# Patient Record
Sex: Male | Born: 1935 | Race: White | Hispanic: No | Smoking: Never smoker
Health system: Southern US, Community
[De-identification: ages and names within clinical notes are randomized; demographics above are authoritative.]

## PROBLEM LIST (undated history)

## (undated) DIAGNOSIS — I4891 Unspecified atrial fibrillation: Secondary | ICD-10-CM

## (undated) DIAGNOSIS — N401 Enlarged prostate with lower urinary tract symptoms: Secondary | ICD-10-CM

## (undated) DIAGNOSIS — H3530 Unspecified macular degeneration: Secondary | ICD-10-CM

## (undated) DIAGNOSIS — I509 Heart failure, unspecified: Secondary | ICD-10-CM

## (undated) DIAGNOSIS — Z7901 Long term (current) use of anticoagulants: Secondary | ICD-10-CM

## (undated) DIAGNOSIS — I4510 Unspecified right bundle-branch block: Secondary | ICD-10-CM

## (undated) DIAGNOSIS — N183 Chronic kidney disease, stage 3 unspecified: Secondary | ICD-10-CM

## (undated) DIAGNOSIS — H409 Unspecified glaucoma: Secondary | ICD-10-CM

## (undated) DIAGNOSIS — E538 Deficiency of other specified B group vitamins: Secondary | ICD-10-CM

## (undated) DIAGNOSIS — H8110 Benign paroxysmal vertigo, unspecified ear: Secondary | ICD-10-CM

## (undated) HISTORY — PX: COLONOSCOPY W/ POLYPECTOMY: SHX1380

---

## 2015-10-02 DIAGNOSIS — N400 Enlarged prostate without lower urinary tract symptoms: Secondary | ICD-10-CM | POA: Diagnosis present

## 2015-10-02 DIAGNOSIS — I509 Heart failure, unspecified: Secondary | ICD-10-CM | POA: Insufficient documentation

## 2015-10-07 DIAGNOSIS — I5032 Chronic diastolic (congestive) heart failure: Secondary | ICD-10-CM | POA: Diagnosis present

## 2015-10-07 DIAGNOSIS — R519 Headache, unspecified: Secondary | ICD-10-CM | POA: Insufficient documentation

## 2019-01-29 ENCOUNTER — Encounter: Payer: Self-pay | Admitting: Emergency Medicine

## 2019-01-29 ENCOUNTER — Other Ambulatory Visit: Payer: Self-pay

## 2019-01-29 ENCOUNTER — Telehealth: Payer: Self-pay | Admitting: Urology

## 2019-01-29 ENCOUNTER — Emergency Department
Admission: EM | Admit: 2019-01-29 | Discharge: 2019-01-29 | Disposition: A | Discharge status: Home / Self Care | Payer: Medicare Other | Attending: Emergency Medicine | Admitting: Emergency Medicine

## 2019-01-29 DIAGNOSIS — R339 Retention of urine, unspecified: Secondary | ICD-10-CM | POA: Diagnosis present

## 2019-01-29 HISTORY — DX: Unspecified atrial fibrillation: I48.91

## 2019-01-29 LAB — URINALYSIS, COMPLETE (UACMP) WITH MICROSCOPIC
Bacteria, UA: NONE SEEN
Bilirubin Urine: NEGATIVE
Glucose, UA: NEGATIVE mg/dL
Hgb urine dipstick: NEGATIVE
Ketones, ur: NEGATIVE mg/dL
Leukocytes,Ua: NEGATIVE
Nitrite: NEGATIVE
Protein, ur: NEGATIVE mg/dL
Specific Gravity, Urine: 1.01 (ref 1.005–1.030)
Squamous Epithelial / HPF: NONE SEEN (ref 0–5)
pH: 7 (ref 5.0–8.0)

## 2019-01-29 NOTE — Telephone Encounter (Signed)
Patient can keep foley and make a new patient appointment for voiding trial when he returns unless he will be gone longer than 4wks from placement thanks

## 2019-01-29 NOTE — ED Notes (Signed)
Leg bag placed on patient and instruction on emptying and changing bag given. Patient verbalized understanding.

## 2019-01-29 NOTE — ED Notes (Signed)
BLADDER SCAN OVER 300ML

## 2019-01-29 NOTE — Telephone Encounter (Signed)
Pt was seen in ER on 01/28/2019 and a foley was placed. He states that he is going [place] on 02[place]02[place], so I advised him to have it removed out of state if he was not going to be in town in 7-10 days. He would be a new pt to [place]. Just FYI, in case he calls back to be seen.

## 2019-01-29 NOTE — ED Triage Notes (Signed)
Patient ambulatory to triage with steady gait, without difficulty or distress noted, mask in place; pt reports rx bactrim 2 days ago for UTI; c/o difficulty voiding and suprapubic pain

## 2019-01-29 NOTE — ED Notes (Signed)
Patient ambulatory to lobby with steady gait and NAD noted. Verbalized understanding of discharge instructions and follow-up care.  

## 2019-01-29 NOTE — ED Provider Notes (Signed)
[facility] Emergency Department Provider Note   First MD Initiated Contact with Patient 01/29/19 [phone]     (approximate)  I have reviewed the triage vital signs and the nursing notes.   HISTORY  Chief Complaint Urinary Retention    HPI Johnathan Byrd is a 85 y.o. male with previous history of urinary retention requiring Foley catheter placement November of last year and recently diagnosed urinary tract infection for which the patient is currently taking Bactrim presents to the emergency department secondary to inability to urinate times a few hours".  Patient denies any fever afebrile on presentation.  Patient admits to 10 out of 10 suprapubic pain.       Past Medical History:  Diagnosis Date  . Atrial fibrillation (HCC)     There are no active problems to display for this patient.   History reviewed. No pertinent surgical history.  Prior to Admission medications   Not on File    Allergies Sulfa antibiotics  No family history on file.  Social History Social History   Tobacco Use  . Smoking status: Not on file  Substance Use Topics  . Alcohol use: Not on file  . Drug use: Not on file    Review of Systems Constitutional: No fever/chills Eyes: No visual changes. ENT: No sore throat. Cardiovascular: Denies chest pain. Respiratory: Denies shortness of breath. Gastrointestinal: No abdominal pain.  No nausea, no vomiting.  No diarrhea.  No constipation. Genitourinary: Negative for dysuria.  Positive for urinary retention Musculoskeletal: Negative for neck pain.  Negative for back pain. Integumentary: Negative for rash. Neurological: Negative for headaches, focal weakness or numbness.  ____________________________________________   PHYSICAL EXAM:  VITAL SIGNS: ED Triage Vitals  Enc Vitals Group     BP 01/29/19 0538 (!) 140/94     Pulse Rate 01/29/19 0538 88     Resp 01/29/19 0538 16     Temp 01/29/19 0538 97.7 F (36.5 C)   Temp Source 01/29/19 0538 Oral     SpO2 01/29/19 0538 98 %     Weight 01/29/19 0530 78.9 kg (174 lb)     Height 01/29/19 0530 1.727 m (5\' 8" )     Head Circumference --      Peak Flow --      Pain Score 01/29/19 0530 10     Pain Loc --      Pain Edu? --      Excl. in [place]? --     Constitutional: Alert and oriented.  Eyes: Conjunctivae are normal.  Mouth/Throat: Mucous membranes are moist. Neck: No stridor.  No meningeal signs.   Cardiovascular: Normal rate, regular rhythm. Good peripheral circulation. Grossly normal heart sounds. Respiratory: Normal respiratory effort.  No retractions. Gastrointestinal: Suprapubic discomfort no distention. Musculoskeletal: No lower extremity tenderness nor edema. No gross deformities of extremities. Neurologic:  Normal speech and language. No gross focal neurologic deficits are appreciated.  Skin:  Skin is warm, dry and intact. Psychiatric: Mood and affect are normal. Speech and behavior are normal.  ____________________________________________   LABS (all labs ordered are listed, but only abnormal results are displayed)  Labs Reviewed  URINALYSIS, COMPLETE (UACMP) WITH MICROSCOPIC   ____________   Procedures   ____________________________________________   INITIAL IMPRESSION / MDM / ASSESSMENT AND PLAN / ED COURSE  As part of my medical decision making, I reviewed the following data within the electronic MEDICAL RECORD NUMBER  85 year old male presenting with above-stated history and physical exam secondary to inability to urinate.  Bladder scan revealed 300 mL's of urine however after Foley catheter placement 700 mL of nonbloody urine obtained.  Foley catheter will be left in place with leg bag.  Patient will be referred to Dr. Erlene Quan (urology) for outpatient follow-up today  ____________________________________________  FINAL CLINICAL IMPRESSION(S) / ED DIAGNOSES  Final diagnoses:  Urinary retention     MEDICATIONS GIVEN DURING  THIS VISIT:  Medications - No data to display   ED Discharge Orders    None      *Please note:  Johnathan Byrd was evaluated in Emergency Department on 01/29/2019 for the symptoms described in the history of present illness. He was evaluated in the context of the global COVID-19 pandemic, which necessitated consideration that the patient might be at risk for infection with the SARS-CoV-2 virus that causes COVID-19. Institutional protocols and algorithms that pertain to the evaluation of patients at risk for COVID-19 are in a state of rapid change based on information released by regulatory bodies including the CDC and federal and state organizations. These policies and algorithms were followed during the patient's care in the ED.  Some ED evaluations and interventions may be delayed as a result of limited staffing during the pandemic.*  Note:  This document was prepared using Dragon voice recognition software and may include unintentional dictation errors.   Gregor Hams, MD 01/29/19 [phone]

## 2019-01-31 ENCOUNTER — Encounter: Payer: Self-pay | Admitting: Emergency Medicine

## 2019-01-31 ENCOUNTER — Emergency Department
Admission: EM | Admit: 2019-01-31 | Discharge: 2019-01-31 | Disposition: A | Discharge status: Home / Self Care | Payer: Medicare Other | Attending: Emergency Medicine | Admitting: Emergency Medicine

## 2019-01-31 ENCOUNTER — Other Ambulatory Visit: Payer: Self-pay

## 2019-01-31 DIAGNOSIS — R39198 Other difficulties with micturition: Secondary | ICD-10-CM | POA: Diagnosis present

## 2019-01-31 DIAGNOSIS — N3000 Acute cystitis without hematuria: Secondary | ICD-10-CM

## 2019-01-31 DIAGNOSIS — Z466 Encounter for fitting and adjustment of urinary device: Secondary | ICD-10-CM | POA: Insufficient documentation

## 2019-01-31 NOTE — ED Triage Notes (Addendum)
States was here 2 days ago for urinary tract infection and urinary retention. States went home with foley catheter and noted last night began voiding around catheter. States also has had drainage noted in bag, clear yellow. Bladder scan results 44ml and 16ml.

## 2019-01-31 NOTE — ED Provider Notes (Signed)
[facility] Emergency Department Provider Note  ____________________________________________  Time seen: Approximately 6:18 PM  I have reviewed the triage vital signs and the nursing notes.   HISTORY  Chief Complaint foley catheter problem    HPI Johnathan Byrd is a 85 y.o. male who presents the emergency department complaining of problems with indwelling catheter.  Patient was seen in this department 2 days ago with urinary retention.  Patient had been diagnosed with UTI/prostatitis and placed on Bactrim.  Patient then had urinary retention and presented to the emergency department with Foley cath placement.  Patient reports that his urinary tract symptoms have improved in regards to pain in the suprapubic region as well as painful urination.  Patient is concerned now as he has urine flowing from around the indwelling catheter.  No other complaints.  Patient is here for catheter concerns only.         Past Medical History:  Diagnosis Date  . Atrial fibrillation ([place])     There are no active problems to display for this patient.   History reviewed. No pertinent surgical history.  Prior to Admission medications   Not on File    Allergies Sulfa antibiotics  No family history on file.  Social History Social History   Tobacco Use  . Smoking status: Not on file  Substance Use Topics  . Alcohol use: Not on file  . Drug use: Not on file     Review of Systems  Constitutional: No fever/chills Eyes: No visual changes. No discharge ENT: No upper respiratory complaints. Cardiovascular: no chest pain. Respiratory: no cough. No SOB. Gastrointestinal: No abdominal pain.  No nausea, no vomiting.  No diarrhea.  No constipation. Genitourinary: Negative for dysuria. No hematuria.  Problem with urination around indwelling Foley cath Musculoskeletal: Negative for musculoskeletal pain. Skin: Negative for rash, abrasions, lacerations,  ecchymosis. Neurological: Negative for headaches, focal weakness or numbness. 10-point ROS otherwise negative.  ____________________________________________   PHYSICAL EXAM:  VITAL SIGNS: ED Triage Vitals  Enc Vitals Group     BP 01/31/19 1651 (!) 145/98     Pulse Rate 01/31/19 1651 81     Resp 01/31/19 1651 18     Temp 01/31/19 1651 98 F (36.7 C)     Temp Source 01/31/19 1651 Oral     SpO2 01/31/19 1651 100 %     Weight 01/31/19 1653 174 lb (78.9 kg)     Height 01/31/19 1653 5\' 8"  (1.727 m)     Head Circumference --      Peak Flow --      Pain Score 01/31/19 1653 8     Pain Loc --      Pain Edu? --      Excl. in [place]? --      Constitutional: Alert and oriented. Well appearing and in no acute distress. Eyes: Conjunctivae are normal. PERRL. EOMI. Head: Atraumatic. ENT:      Ears:       Nose: No congestion/rhinnorhea.      Mouth/Throat: Mucous membranes are moist.  Neck: No stridor.    Cardiovascular: Normal rate, regular rhythm. Normal S1 and S2.  Good peripheral circulation. Respiratory: Normal respiratory effort without tachypnea or retractions. Lungs CTAB. Good air entry to the bases with no decreased or absent breath sounds. Gastrointestinal: Bowel sounds 4 quadrants. Soft and nontender to palpation. No guarding or rigidity. No palpable masses. No distention. No CVA tenderness. Musculoskeletal: Full range of motion to all extremities. No gross deformities  appreciated. Neurologic:  Normal speech and language. No gross focal neurologic deficits are appreciated.  Skin:  Skin is warm, dry and intact. No rash noted. Psychiatric: Mood and affect are normal. Speech and behavior are normal. Patient exhibits appropriate insight and judgement.   ____________________________________________   LABS (all labs ordered are listed, but only abnormal results are displayed)  Labs Reviewed - No data to  display ____________________________________________  EKG   ____________________________________________  RADIOLOGY   No results found.  ____________________________________________    PROCEDURES  Procedure(s) performed:    Procedures    Medications - No data to display   ____________________________________________   INITIAL IMPRESSION / ASSESSMENT AND PLAN / ED COURSE  Pertinent labs & imaging results that were available during my care of the patient were reviewed by me and considered in my medical decision making (see chart for details).  Review of the [place] CSRS was performed in accordance of the [facility] prior to dispensing any controlled drugs.           Patient's diagnosis is consistent with urinary catheter change, acute cystitis.  Patient presented to the emergency department complaining of urination around his indwelling Foley cath.  Patient has been seen in this department 2 days ago for urinary retention secondary to urinary tract infection versus prostatitis.  Patient was taken Bactrim for same.  Indwelling urinary catheter was placed.  Patient is now having urination around the catheter itself.  I suspect at this time that antibiotics are reducing inflammation and urinary catheter needs to be replaced with larger catheter.  Patient has no concerning symptoms of fevers or chills, suprapubic pain, flank pain.  Urinary catheter is replaced and patient will follow-up with urology.  Continue antibiotic use at home..  Patient is given ED precautions to return to the ED for any worsening or new symptoms.     ____________________________________________  FINAL CLINICAL IMPRESSION(S) / ED DIAGNOSES  Final diagnoses:  Catheter (urine) change required  Acute cystitis without hematuria      NEW MEDICATIONS STARTED DURING THIS VISIT:  ED Discharge Orders    None          This chart was dictated using voice recognition software/Dragon. Despite best  efforts to proofread, errors can occur which can change the meaning. Any change was purely unintentional.    Racheal PatchesCuthriell, Marke Goodwyn D, PA-C 01/31/19 1834    Emily FilbertWilliams, Nabil Bubolz E, MD 01/31/19 2249

## 2019-04-21 DIAGNOSIS — H3530 Unspecified macular degeneration: Secondary | ICD-10-CM | POA: Insufficient documentation

## 2019-04-21 DIAGNOSIS — N401 Enlarged prostate with lower urinary tract symptoms: Secondary | ICD-10-CM | POA: Insufficient documentation

## 2019-04-21 DIAGNOSIS — I4819 Other persistent atrial fibrillation: Secondary | ICD-10-CM | POA: Diagnosis present

## 2019-07-29 ENCOUNTER — Other Ambulatory Visit: Payer: Self-pay

## 2019-07-29 ENCOUNTER — Ambulatory Visit: Payer: Medicare Other | Admitting: Dermatology

## 2019-07-29 DIAGNOSIS — L219 Seborrheic dermatitis, unspecified: Secondary | ICD-10-CM

## 2019-07-29 DIAGNOSIS — L820 Inflamed seborrheic keratosis: Secondary | ICD-10-CM

## 2019-07-29 MED ORDER — MOMETASONE FUROATE 0.1 % EX SOLN: Freq: Every day | CUTANEOUS | 1 refills | Status: DC

## 2019-07-29 NOTE — Patient Instructions (Signed)

## 2019-07-29 NOTE — Progress Notes (Signed)
   Follow-Up Visit   Subjective  Johnathan Byrd is a 86 y.o. male who presents for the following: Follow-up (Seborrheic Dermatitis - scalp. Still itches but is better. He is using Ketoconazole 2% shampoo daily.) and Seborrheic Keratosis (Inflamed SK follow up - back. Treated with LN2 x 21.). Seborrheic Keratosis Patient complains of seborrheic keratosis.  The lesion has not changed in color recently, and has not changed in size. The lesion is irritating and itchy. Patient denies bleeding or ulceration.  Patient denies other skin lesions. Patient wishes the lesion treated via destruction therapy.   The following portions of the chart were reviewed this encounter and updated as appropriate:     Review of Systems: No other skin or systemic complaints.  Objective  Well appearing patient in no apparent distress; mood and affect are within normal limits.  A focused examination was performed including back, scalp. Relevant physical exam findings are noted in the Assessment and Plan.  Objective  Back (25): Stuck on waxy paps with erythema   Objective  Scalp: Pink patches.  Assessment & Plan  Inflamed seborrheic keratosis (25) Back  Destruction of lesion - Back Complexity: simple   Destruction method: cryotherapy   Informed consent: discussed and consent obtained   Timeout:  patient name, date of birth, surgical site, and procedure verified Lesion destroyed using liquid nitrogen: Yes   Region frozen until ice ball extended beyond lesion: Yes   Outcome: patient tolerated procedure well with no complications   Post-procedure details: wound care instructions given    Seborrheic dermatitis Scalp  Continue Ketoconazole 2% shampoo qod alternating with Head and Shoulders shampoo.  Ordered Medications: mometasone (ELOCON) 0.1 % lotion  qhs 3 times per week  Return in about 6 months (around 01/29/2020).   Kirke Shaggy, MD, am acting as scribe for Armida Sans, MD .

## 2020-01-27 ENCOUNTER — Ambulatory Visit: Payer: Medicare Other | Admitting: Dermatology

## 2020-02-23 ENCOUNTER — Other Ambulatory Visit: Payer: Self-pay | Admitting: Family Medicine

## 2020-02-23 DIAGNOSIS — G459 Transient cerebral ischemic attack, unspecified: Secondary | ICD-10-CM

## 2020-03-01 ENCOUNTER — Ambulatory Visit
Admission: RE | Admit: 2020-03-01 | Discharge: 2020-03-01 | Disposition: A | Discharge status: Home / Self Care | Payer: Medicare Other | Source: Ambulatory Visit | Attending: Family Medicine | Admitting: Family Medicine

## 2020-03-01 ENCOUNTER — Other Ambulatory Visit: Payer: Self-pay

## 2020-03-01 DIAGNOSIS — G459 Transient cerebral ischemic attack, unspecified: Secondary | ICD-10-CM | POA: Insufficient documentation

## 2020-06-07 ENCOUNTER — Ambulatory Visit (INDEPENDENT_AMBULATORY_CARE_PROVIDER_SITE_OTHER): Payer: Medicare Other

## 2020-06-07 ENCOUNTER — Ambulatory Visit
Admission: EM | Admit: 2020-06-07 | Discharge: 2020-06-07 | Disposition: A | Discharge status: Home / Self Care | Payer: Medicare Other | Attending: Family Medicine | Admitting: Family Medicine

## 2020-06-07 ENCOUNTER — Other Ambulatory Visit: Payer: Self-pay

## 2020-06-07 DIAGNOSIS — R0789 Other chest pain: Secondary | ICD-10-CM | POA: Diagnosis not present

## 2020-06-07 DIAGNOSIS — W19XXXA Unspecified fall, initial encounter: Secondary | ICD-10-CM

## 2020-06-07 DIAGNOSIS — R079 Chest pain, unspecified: Secondary | ICD-10-CM | POA: Diagnosis not present

## 2020-06-07 NOTE — Discharge Instructions (Addendum)
Your x ray was normal You can take tylenol for the pain Follow up as needed for continued or worsening symptoms

## 2020-06-07 NOTE — ED Triage Notes (Signed)
Patient presents to Urgent Care with complaints of he slipped and fell landing on his chest area, c/o discomfort with breathing since last weds. Pt concerned with fracture req x-ray. Pt states he is on Eliquis. Pt ddenies hitting head. Pt states he spoke with triage rn PCP office instructed pt to take ASA

## 2020-06-08 NOTE — ED Provider Notes (Signed)
[name]    CSN: [id number] Arrival date & time: 06/07/20  1102      History   Chief Complaint Chief Complaint  Patient presents with  . Fall    HPI Johnathan Byrd is a 87 y.o. male.   Patient is an 87 year old male who presents today for right chest wall pain.  This is been present since approximately 1 week ago when he fell landing on the chest area, hands.  Most of injuries have healed but he is left with some mild right chest wall pain.  Pain with deep breathing.  No bruising or swelling.  Denies hitting head or any loss consciousness in the fall.  He is on blood thinners.  Was instructed to take aspirin by his doctor which gives him some relief.  No shortness of breath     Past Medical History:  Diagnosis Date  . Atrial fibrillation (HCC)     There are no problems to display for this patient.   History reviewed. No pertinent surgical history.     Home Medications    Prior to Admission medications   Medication Sig Start Date End Date Taking? Authorizing Provider  apixaban (ELIQUIS) 5 MG TABS tablet Take by mouth. 03/25/18   [provider]  brimonidine (ALPHAGAN) 0.2 % ophthalmic solution 1 drop two (2) times a day.    [provider]  finasteride (PROSCAR) 5 MG tablet TK 1 T PO QD 01/15/19   [provider]  metoprolol succinate (TOPROL-XL) 25 MG 24 hr tablet Take by mouth. 03/25/18   [provider]  mometasone (ELOCON) 0.1 % lotion Apply topically at bedtime. 3 days per week - Monday, Wednesday, Friday 07/29/19   Deirdre Evener, MD  terazosin (HYTRIN) 10 MG capsule Take by mouth.    [provider]    Family History Family History  Problem Relation Age of Onset  . Alzheimer's disease Mother   . Stroke Father     Social History Social History   Tobacco Use  . Smoking status: Never Smoker  . Smokeless tobacco: Never Used  Substance Use Topics  . Alcohol use: Never     Allergies   Sulfa  antibiotics   Review of Systems Review of Systems   Physical Exam Triage Vital Signs ED Triage Vitals  Enc Vitals Group     BP 06/07/20 1115 102/69     Pulse Rate 06/07/20 1115 76     Resp 06/07/20 1115 16     Temp 06/07/20 1115 98 F (36.7 C)     Temp Source 06/07/20 1115 Temporal     SpO2 06/07/20 1115 96 %     Weight 06/07/20 1113 179 lb (81.2 kg)     Height --      Head Circumference --      Peak Flow --      Pain Score 06/07/20 1113 0     Pain Loc --      Pain Edu? --      Excl. in [place]? --    No data found.  Updated Vital Signs BP 102/69 (BP Location: Left Arm)   Pulse 76   Temp 98 F (36.7 C) (Temporal)   Resp 16   Wt 179 lb (81.2 kg)   SpO2 96%   BMI 27.22 kg/m   Visual Acuity Right Eye Distance:   Left Eye Distance:   Bilateral Distance:    Right Eye Near:   Left Eye Near:  Bilateral Near:     Physical Exam Vitals and nursing note reviewed.  Constitutional:      General: He is not in acute distress.    Appearance: Normal appearance. He is not ill-appearing, toxic-appearing or diaphoretic.     Comments: Very pleasant   HENT:     Head: Normocephalic and atraumatic.     Nose: Nose normal.  Eyes:     Conjunctiva/sclera: Conjunctivae normal.  Cardiovascular:     Rate and Rhythm: Normal rate and regular rhythm.  Pulmonary:     Effort: Pulmonary effort is normal.     Breath sounds: Normal breath sounds.  Chest:       Comments: Area of tenderness  Musculoskeletal:        General: Normal range of motion.     Cervical back: Normal range of motion.  Skin:    General: Skin is warm and dry.  Neurological:     Mental Status: He is alert.  Psychiatric:        Mood and Affect: Mood normal.      UC Treatments / Results  Labs (all labs ordered are listed, but only abnormal results are displayed) Labs Reviewed - No data to display  EKG   Radiology DG Chest 2 View  Result Date: 06/07/2020 CLINICAL DATA:  Right chest pain post recent  fall EXAM: CHEST - 2 VIEW COMPARISON:  None. FINDINGS: Top-normal heart size. Increased right pericardiophrenic density probably reflects a prominent fat pad. No consolidation or edema. No pleural effusion. No pneumothorax. No acute displaced rib fracture identified. Decreased osseous mineralization with age-indeterminate wedging of midthoracic vertebral bodies. IMPRESSION: No displaced rib fracture or pneumothorax. Electronically Signed   By: Guadlupe Spanish M.D.   On: 06/07/2020 11:52    Procedures Procedures (including critical care time)  Medications Ordered in UC Medications - No data to display  Initial Impression / Assessment and Plan / UC Course  I have reviewed the triage vital signs and the nursing notes.  Pertinent labs & imaging results that were available during my care of the patient were reviewed by me and considered in my medical decision making (see chart for details).     Chest wall pain No concerns on exam.  Lungs clear.  X-ray normal.  Recommend Tylenol for the pain Follow up as needed for continued or worsening symptoms  Final Clinical Impressions(s) / UC Diagnoses   Final diagnoses:  Chest wall pain     Discharge Instructions     Your x ray was normal You can take tylenol for the pain Follow up as needed for continued or worsening symptoms     ED Prescriptions    None     PDMP not reviewed this encounter.   Janace Aris, NP 06/08/20 [phone]

## 2020-07-15 ENCOUNTER — Other Ambulatory Visit: Payer: Self-pay | Admitting: Neurology

## 2020-07-15 ENCOUNTER — Other Ambulatory Visit (HOSPITAL_COMMUNITY): Payer: Self-pay | Admitting: Neurology

## 2020-07-15 DIAGNOSIS — R569 Unspecified convulsions: Secondary | ICD-10-CM

## 2020-07-15 DIAGNOSIS — G459 Transient cerebral ischemic attack, unspecified: Secondary | ICD-10-CM

## 2020-07-29 ENCOUNTER — Ambulatory Visit
Admission: RE | Admit: 2020-07-29 | Discharge: 2020-07-29 | Disposition: A | Discharge status: Home / Self Care | Payer: Medicare Other | Source: Ambulatory Visit | Attending: Neurology | Admitting: Neurology

## 2020-07-29 ENCOUNTER — Other Ambulatory Visit: Payer: Self-pay

## 2020-07-29 DIAGNOSIS — R569 Unspecified convulsions: Secondary | ICD-10-CM | POA: Insufficient documentation

## 2020-07-29 DIAGNOSIS — G459 Transient cerebral ischemic attack, unspecified: Secondary | ICD-10-CM

## 2020-09-20 ENCOUNTER — Other Ambulatory Visit: Payer: Self-pay

## 2020-09-20 ENCOUNTER — Ambulatory Visit
Admission: EM | Admit: 2020-09-20 | Discharge: 2020-09-20 | Disposition: A | Discharge status: Home / Self Care | Payer: Medicare Other

## 2020-09-20 DIAGNOSIS — M542 Cervicalgia: Secondary | ICD-10-CM | POA: Diagnosis not present

## 2020-09-20 NOTE — ED Provider Notes (Signed)
[name]    CSN: [id number] Arrival date & time: 09/20/20  1344      History   Chief Complaint Chief Complaint  Patient presents with  . R neck pulsating and painful     HPI BODEE LAFOE is a 87 y.o. male.   Patient states he had a painful, pulsating rope like area on the right side of his neck during the night.  The pain improved when he applied pressure over the area with his fingers.  His symptoms have resolved now.  He denies focal weakness, numbness, chest pain, shortness of breath, dizziness, headache, or other symptoms.  He had a carotid ultrasound in October 2021.  He was going to see his PCP today but he is out of town until next week.  His medical history includes atrial fibrillation.  The history is provided by the patient and medical records.    Past Medical History:  Diagnosis Date  . Atrial fibrillation (HCC)     There are no problems to display for this patient.   History reviewed. No pertinent surgical history.     Home Medications    Prior to Admission medications   Medication Sig Start Date End Date Taking? Authorizing Provider  apixaban (ELIQUIS) 5 MG TABS tablet Take by mouth. 03/25/18  Yes [provider]  brimonidine (ALPHAGAN) 0.2 % ophthalmic solution 1 drop two (2) times a day.   Yes [provider]  Cyanocobalamin (VITAMIN B-12) 2500 MCG SUBL Place under the tongue.   Yes [provider]  metoprolol succinate (TOPROL-XL) 25 MG 24 hr tablet Take by mouth. 03/25/18  Yes [provider]  finasteride (PROSCAR) 5 MG tablet TK 1 T PO QD 01/15/19   [provider]  mometasone (ELOCON) 0.1 % lotion Apply topically at bedtime. 3 days per week - Monday, Wednesday, Friday 07/29/19   Deirdre Evener, MD  terazosin (HYTRIN) 10 MG capsule Take by mouth.    [provider]    Family History Family History  Problem Relation Age of Onset  . Alzheimer's disease Mother   . Stroke Father      Social History Social History   Tobacco Use  . Smoking status: Never Smoker  . Smokeless tobacco: Never Used  Substance Use Topics  . Alcohol use: Never     Allergies   Sulfa antibiotics   Review of Systems Review of Systems  Constitutional: Negative for chills and fever.  Respiratory: Negative for cough and shortness of breath.   Cardiovascular: Negative for chest pain and palpitations.  Gastrointestinal: Negative for abdominal pain and vomiting.  Skin: Negative for color change and rash.  Neurological: Negative for dizziness, syncope, weakness, numbness and headaches.  All other systems reviewed and are negative.    Physical Exam Triage Vital Signs ED Triage Vitals  Enc Vitals Group     BP      Pulse      Resp      Temp      Temp src      SpO2      Weight      Height      Head Circumference      Peak Flow      Pain Score      Pain Loc      Pain Edu?      Excl. in [place]?    No data found.  Updated Vital Signs BP 120/86 (BP Location: Left Arm)   Pulse 62  Temp 98.1 F (36.7 C) (Oral)   Resp 18   SpO2 96%   Visual Acuity Right Eye Distance:   Left Eye Distance:   Bilateral Distance:    Right Eye Near:   Left Eye Near:    Bilateral Near:     Physical Exam Vitals and nursing note reviewed.  Constitutional:      General: He is not in acute distress.    Appearance: He is well-developed. He is not ill-appearing.  HENT:     Head: Normocephalic and atraumatic.  Eyes:     Conjunctiva/sclera: Conjunctivae normal.  Neck:     Vascular: No carotid bruit.  Cardiovascular:     Rate and Rhythm: Normal rate. Rhythm irregular.     Heart sounds: Normal heart sounds.  Pulmonary:     Effort: Pulmonary effort is normal. No respiratory distress.     Breath sounds: Normal breath sounds.  Abdominal:     Palpations: Abdomen is soft.     Tenderness: There is no abdominal tenderness.  Musculoskeletal:        General: Normal range of motion.     Cervical  back: Normal range of motion and neck supple. No rigidity or tenderness.  Lymphadenopathy:     Cervical: No cervical adenopathy.  Skin:    General: Skin is warm and dry.     Findings: No bruising, erythema, lesion or rash.  Neurological:     General: No focal deficit present.     Mental Status: He is alert and oriented to person, place, and time.     Sensory: No sensory deficit.     Motor: No weakness.     Gait: Gait normal.  Psychiatric:        Mood and Affect: Mood normal.        Behavior: Behavior normal.      UC Treatments / Results  Labs (all labs ordered are listed, but only abnormal results are displayed) Labs Reviewed - No data to display  EKG   Radiology No results found.  Procedures Procedures (including critical care time)  Medications Ordered in UC Medications - No data to display  Initial Impression / Assessment and Plan / UC Course  I have reviewed the triage vital signs and the nursing notes.  Pertinent labs & imaging results that were available during my care of the patient were reviewed by me and considered in my medical decision making (see chart for details).   Right side neck pain.  Patient is well-appearing, currently asymptomatic, vital signs stable.  Discussed limitations of being seen for his symptoms in an urgent care setting.  Strict ED precautions given.  Instructed him to follow-up with his PCP next week.  He agrees to plan of care.       Final Clinical Impressions(s) / UC Diagnoses   Final diagnoses:  Neck pain on right side     Discharge Instructions     Go to the emergency department if you have acute worsening symptoms.    Follow-up with your primary care provider next week.        ED Prescriptions    None     PDMP not reviewed this encounter.   Mickie Bail, NP 09/20/20 1517

## 2020-09-20 NOTE — Discharge Instructions (Addendum)
Go to the emergency department if you have acute worsening symptoms.    Follow-up with your primary care provider next week.

## 2020-09-20 NOTE — ED Triage Notes (Signed)
Pt states this morning the R side of his neck was pulsating and painful this morning.  Looked and felt like a tube running down his neck.  Stopped pulsating and stopped the pain to hold his finger down on it.

## 2020-10-13 ENCOUNTER — Other Ambulatory Visit: Payer: Self-pay | Admitting: Dermatology

## 2021-02-15 ENCOUNTER — Ambulatory Visit: Payer: Medicare Other | Admitting: Dermatology

## 2021-02-15 ENCOUNTER — Other Ambulatory Visit: Payer: Self-pay

## 2021-02-15 ENCOUNTER — Encounter: Payer: Self-pay | Admitting: Dermatology

## 2021-02-15 DIAGNOSIS — L820 Inflamed seborrheic keratosis: Secondary | ICD-10-CM | POA: Diagnosis not present

## 2021-02-15 DIAGNOSIS — L299 Pruritus, unspecified: Secondary | ICD-10-CM

## 2021-02-15 DIAGNOSIS — L304 Erythema intertrigo: Secondary | ICD-10-CM

## 2021-02-15 DIAGNOSIS — L219 Seborrheic dermatitis, unspecified: Secondary | ICD-10-CM

## 2021-02-15 NOTE — Progress Notes (Signed)
   Follow-Up Visit   Subjective  Johnathan Byrd is a 87 y.o. male who presents for the following: Seborrheic Dermatitis (Scalp, >1 yr f/u, itchy Ketoconazole 2% shampoo and Mometasone lotion hs didn't help has taken Injuv Hyaluronic acid) and check spot (Chest, irritating, ~72m). He continues to itch and no medication or other treatment has seemed to help his scalp.  He itches on the sides and posterior scalp and this wakes much up at night. He also has other spots he would like checked a spot on his chest is irritating and would like treated.  The following portions of the chart were reviewed this encounter and updated as appropriate:   Tobacco  Allergies  Meds  Problems  Med Hx  Surg Hx  Fam Hx     Review of Systems:  No other skin or systemic complaints except as noted in HPI or Assessment and Plan.  Objective  Well appearing patient in no apparent distress; mood and affect are within normal limits.  A focused examination was performed including scalp, chest. Relevant physical exam findings are noted in the Assessment and Plan.  post scalp, sides of scalp Post scalp and sides of scalp clear  infrapectoral Erythema, pinkness infrapectoral  R infrapectoral x 1, Total = 1 Erythematous keratotic or waxy stuck-on papule or plaque.    Assessment & Plan  Seborrheic dermatitis with severe pruritus of the scalp.  This may represent neurodermatitis/atopic neurodermatitis the scalp post scalp, sides of scalp  Seborrheic Dermatitis with pruritis post scalp and sides of scalp -  is a chronic persistent rash characterized by pinkness and scaling most commonly of the mid face but also can occur on the scalp (dandruff), ears; mid chest, mid back and groin.  It tends to be exacerbated by stress and cooler weather.  People who have neurologic disease may experience new onset or exacerbation of existing seborrheic dermatitis.  The condition is not curable but treatable and can be  controlled.  Start [facility] Amitriptyline: 5%, Gabapentin: 10%, Lidocaine: 5% cr thin coat qhs to aa scalp for itching  Intertrigo infrapectoral Intertrigo is a chronic recurrent rash that occurs in skin fold areas that may be associated with friction; heat; moisture; yeast; fungus; and bacteria.  It is exacerbated by increased movement / activity; sweating; and higher atmospheric temperature.  Start [facility] Iodoquinol 1%, Hydrocortisone 2.5%, Niacinamide 2% Cream qhs to affected areas until clear, then prn flares.  The patient was advised this is not covered by insurance since it is made by a compounding pharmacy. They will receive an email to check out and the medication will be mailed to their home.    Inflamed seborrheic keratosis R infrapectoral x 1, Total = 1  Destruction of lesion - R infrapectoral x 1, Total = 1 Complexity: simple   Destruction method: cryotherapy   Informed consent: discussed and consent obtained   Timeout:  patient name, date of birth, surgical site, and procedure verified Lesion destroyed using liquid nitrogen: Yes   Region frozen until ice ball extended beyond lesion: Yes   Outcome: patient tolerated procedure well with no complications   Post-procedure details: wound care instructions given    Return in about 6 weeks (around 03/29/2021) for seb derm, intertrigo, ISK.  I, Sonya Hupman, RMA, am acting as scribe for Armida Sans, MD . Documentation: I have reviewed the above documentation for accuracy and completeness, and I agree with the above.  Armida Sans, MD

## 2021-02-15 NOTE — Patient Instructions (Addendum)

## 2021-02-28 ENCOUNTER — Telehealth: Payer: Self-pay

## 2021-02-28 NOTE — Telephone Encounter (Signed)
Returned pt's call about him not getting his 2 prescriptions.  Pt advised he spoke to [facility] and paid by credit card for his 2 prescriptions that we sent in.  Pt said it has been about a week and he has not gotten his prescriptions.  I called [facility] and left a message for them to reach out to pt and [place] with status of patient's prescription./sh

## 2021-02-28 NOTE — Telephone Encounter (Signed)
[facility] left message this afternoon that they were able to speak to patient and his prescriptions have been processed.Cecilie Kicks

## 2021-03-27 ENCOUNTER — Ambulatory Visit: Payer: Medicare Other | Admitting: Dermatology

## 2021-03-27 ENCOUNTER — Other Ambulatory Visit: Payer: Self-pay

## 2021-03-27 DIAGNOSIS — L820 Inflamed seborrheic keratosis: Secondary | ICD-10-CM

## 2021-03-27 DIAGNOSIS — L304 Erythema intertrigo: Secondary | ICD-10-CM | POA: Diagnosis not present

## 2021-03-27 DIAGNOSIS — L299 Pruritus, unspecified: Secondary | ICD-10-CM

## 2021-03-27 DIAGNOSIS — L219 Seborrheic dermatitis, unspecified: Secondary | ICD-10-CM | POA: Diagnosis not present

## 2021-03-27 MED ORDER — KETOCONAZOLE 2 % EX SHAM: MEDICATED_SHAMPOO | CUTANEOUS | 6 refills | Status: DC

## 2021-03-27 NOTE — Progress Notes (Signed)
   Follow-Up Visit   Subjective  Johnathan Byrd is a 87 y.o. male who presents for the following: seborrheic dermatitis (Of the scalp - patient has been using the [facility] mix for about 3 weeks now, but hasn't noticed much of an improvement. His scalp continues to itch and bother him. ) and intertrigo (Of the infra pectoral area - patient currently using the [facility] mix and has noticed some improvement since starting treatment. ).  The following portions of the chart were reviewed this encounter and updated as appropriate:   Tobacco  Allergies  Meds  Problems  Med Hx  Surg Hx  Fam Hx     Review of Systems:  No other skin or systemic complaints except as noted in HPI or Assessment and Plan.  Objective  Well appearing patient in no apparent distress; mood and affect are within normal limits.  A focused examination was performed including the face, scalp, and trunk. Relevant physical exam findings are noted in the Assessment and Plan.  Infra pectoral Infra pectoral area clear.   R infra pectoral x 2 (2) Erythematous keratotic or waxy stuck-on papule or plaque.    Assessment & Plan  Seborrheic dermatitis Scalp  with severe pruritus of the scalp.  This may represent neurodermatitis/atopic neurodermatitis the scalp, improved but not to goal on the scalp.   Seborrheic Dermatitis  -  is a chronic persistent rash characterized by pinkness and scaling most commonly of the mid face but also can occur on the scalp (dandruff), ears; mid chest, mid back and groin.  It tends to be exacerbated by stress and cooler weather.  People who have neurologic disease may experience new onset or exacerbation of existing seborrheic dermatitis.  The condition is not curable but treatable and can be controlled.  Continue [facility] Amitriptyline: 5%, Gabapentin: 10%, Lidocaine: 5% cr thin coat qhs to aa scalp for itching  Start Ketoconazole 2% shampoo let sit 5-10 minutes before  washing off. Use 2-3 days per week.   ketoconazole (NIZORAL) 2 % shampoo - Scalp Shampoo into the scalp let sit 5-10 minutes then wash out. Use 2-3 days per week.  Erythema intertrigo Infra pectoral Improving but persistent and not to goal.  Intertrigo is a chronic recurrent rash that occurs in skin fold areas that may be associated with friction; heat; moisture; yeast; fungus; and bacteria.  It is exacerbated by increased movement / activity; sweating; and higher atmospheric temperature.  Continue [facility] Iodoquinol 1%, Hydrocortisone 2.5%, Niacinamide 2% Cream qhs to affected areas until clear, then prn flares.  Inflamed seborrheic keratosis R infra pectoral x 2  Destruction of lesion - R infra pectoral x 2 Complexity: simple   Destruction method: cryotherapy   Informed consent: discussed and consent obtained   Timeout:  patient name, date of birth, surgical site, and procedure verified Lesion destroyed using liquid nitrogen: Yes   Region frozen until ice ball extended beyond lesion: Yes   Outcome: patient tolerated procedure well with no complications   Post-procedure details: wound care instructions given    Return in about 6 months (around 09/24/2021).  Maylene Roes, CMA, am acting as scribe for Armida Sans, MD . Documentation: I have reviewed the above documentation for accuracy and completeness, and I agree with the above.  Armida Sans, MD

## 2021-03-27 NOTE — Patient Instructions (Signed)

## 2021-04-02 ENCOUNTER — Encounter: Payer: Self-pay | Admitting: Dermatology

## 2021-07-16 ENCOUNTER — Other Ambulatory Visit: Payer: Self-pay

## 2021-07-16 ENCOUNTER — Encounter: Payer: Self-pay | Admitting: Emergency Medicine

## 2021-07-16 ENCOUNTER — Emergency Department
Admission: EM | Admit: 2021-07-16 | Discharge: 2021-07-16 | Disposition: A | Discharge status: Home / Self Care | Payer: Medicare Other | Attending: Emergency Medicine | Admitting: Emergency Medicine

## 2021-07-16 DIAGNOSIS — R339 Retention of urine, unspecified: Secondary | ICD-10-CM | POA: Diagnosis not present

## 2021-07-16 DIAGNOSIS — Z7901 Long term (current) use of anticoagulants: Secondary | ICD-10-CM | POA: Diagnosis not present

## 2021-07-16 DIAGNOSIS — R338 Other retention of urine: Secondary | ICD-10-CM

## 2021-07-16 DIAGNOSIS — I4891 Unspecified atrial fibrillation: Secondary | ICD-10-CM | POA: Diagnosis not present

## 2021-07-16 LAB — URINALYSIS, ROUTINE W REFLEX MICROSCOPIC
Bilirubin Urine: NEGATIVE
Glucose, UA: NEGATIVE mg/dL
Ketones, ur: 5 mg/dL — AB
Leukocytes,Ua: NEGATIVE
Nitrite: NEGATIVE
Protein, ur: NEGATIVE mg/dL
RBC / HPF: >50 RBC/hpf — ABNORMAL HIGH (ref 0–5)
Specific Gravity, Urine: 1.01 (ref 1.005–1.030)
Squamous Epithelial / HPF: NONE SEEN (ref 0–5)
pH: 6 (ref 5.0–8.0)

## 2021-07-16 NOTE — Discharge Instructions (Addendum)
Follow up with Dr. Arita Miss who is the urologist on call.  You will need to keep the foley in until you have been seen by him. ? ? ?

## 2021-07-16 NOTE — ED Triage Notes (Signed)
Pt via POV from home. Pt c/o urinary retention, states that last time he used restroom was yesterday afternoon. Pt is A&Ox4 and NAD ?

## 2021-07-16 NOTE — ED Notes (Signed)
See triage note  presents with some difficulty with urination  bladder scan showed 550 ml in bladder  ?

## 2021-07-16 NOTE — ED Provider Notes (Signed)
? ?[facility] ?Provider Note ? ? ? Event Date/Time  ? First MD Initiated Contact with Patient 07/16/21 [phone]   ?  (approximate) ? ? ?History  ? ?Urinary Retention ? ? ?HPI ? ?Johnathan Byrd is a 88 y.o. male   presents to the ED with complaint of urinary retention that started yesterday.  Patient denies any previous problems and denies any urinary symptoms or history of kidney stones.  Patient has a history of atrial fibs and takes Eliquis for this.  Rates his pain as 9 out of 10. ? ? ?Physical Exam  ? ?Triage Vital Signs: ?ED Triage Vitals [07/16/21 [id number]  ?Enc Vitals Group  ?   BP (!) 151/95  ?   Pulse Rate 81  ?   Resp 20  ?   Temp (!) 97.4 ?F (36.3 ?C)  ?   Temp Source Oral  ?   SpO2 99 %  ?   Weight 182 lb (82.6 kg)  ?   Height 5\' 8"  (1.727 m)  ?   Head Circumference   ?   Peak Flow   ?   Pain Score 9  ?   Pain Loc   ?   Pain Edu?   ?   Excl. in [place]?   ? ? ?Most recent vital signs: ?Vitals:  ? 07/16/21 0927 07/16/21 1035  ?BP: (!) 151/95 (!) 113/100  ?Pulse: 81 78  ?Resp: 20 18  ?Temp: (!) 97.4 ?F (36.3 ?C)   ?SpO2: 99% 97%  ? ? ? ?General: Awake, no distress.  Alert, talkative. ?CV:  Good peripheral perfusion.  Irregularly irregular rhythm. ?Resp:  Normal effort.  Lungs are clear bilaterally. ?Abd:  No distention.  Soft, nontender, bowel sounds normoactive x4 quadrants. ?Other:  No edema noted lower extremities. ? ? ?ED Results / Procedures / Treatments  ? ?Labs ?(all labs ordered are listed, but only abnormal results are displayed) ?Labs Reviewed  ?URINALYSIS, ROUTINE W REFLEX MICROSCOPIC - Abnormal; Notable for the following components:  ?    Result Value  ? Color, Urine YELLOW (*)   ? APPearance CLEAR (*)   ? Hgb urine dipstick LARGE (*)   ? Ketones, ur 5 (*)   ? RBC / HPF >50 (*)   ? Bacteria, UA RARE (*)   ? All other components within normal limits  ? ? ? ? ?PROCEDURES: ? ?Critical Care performed:  ? ?Procedures ? ? ?MEDICATIONS ORDERED IN ED: ?Medications - No data to  display ? ? ?IMPRESSION / MDM / ASSESSMENT AND PLAN / ED COURSE  ?I reviewed the triage vital signs and the nursing notes. ? ? ?Differential diagnosis includes, but is not limited to, acute urinary retention, urinary tract infection, BPH. ? ?88 year old male presents to the ED with acute urinary retention.  Patient has been unable to void since yesterday afternoon and is starting to feel uncomfortable.  He denies any previous urinary tract infections or previous urinary retention.  Patient takes Eliquis for chronic atrial fib.  He also has history of chronic diastolic heart failure and chronic renal failure stage III.  Patient had a Foley catheter inserted which was difficult on the first attempt.  This could account for the RBCs seen on urinalysis.  Patient is encouraged to leave the Foley catheter in until he is able to be seen by urology for further evaluation of his urinary retention.  Patient is agreeable and leg bag was placed.  Patient is return to the emergency department  if any severe worsening of his symptoms such as fever or chills. ? ? ? ?  ? ? ?FINAL CLINICAL IMPRESSION(S) / ED DIAGNOSES  ? ?Final diagnoses:  ?Acute urinary retention  ? ? ? ?Rx / DC Orders  ? ?ED Discharge Orders   ? ? None  ? ?  ? ? ? ?Note:  This document was prepared using Dragon voice recognition software and may include unintentional dictation errors. ?  ?Johnn Hai, PA-C ?07/16/21 1309 ? ?  ?Lucrezia Starch, MD ?07/16/21 1425 ? ?

## 2021-08-31 ENCOUNTER — Other Ambulatory Visit: Payer: Self-pay | Admitting: Urology

## 2021-08-31 DIAGNOSIS — R972 Elevated prostate specific antigen [PSA]: Secondary | ICD-10-CM

## 2021-09-08 ENCOUNTER — Ambulatory Visit
Admission: RE | Admit: 2021-09-08 | Discharge: 2021-09-08 | Disposition: A | Discharge status: Home / Self Care | Payer: Medicare Other | Source: Ambulatory Visit | Attending: Urology | Admitting: Urology

## 2021-09-08 DIAGNOSIS — R972 Elevated prostate specific antigen [PSA]: Secondary | ICD-10-CM | POA: Diagnosis present

## 2021-09-08 MED ORDER — GADOBUTROL 1 MMOL/ML IV SOLN
8.0000 mL | Freq: Once | INTRAVENOUS | Status: AC | PRN
  Administered 2021-09-08: 8 mL via INTRAVENOUS

## 2021-09-12 ENCOUNTER — Ambulatory Visit: Payer: Medicare Other | Admitting: Podiatry

## 2021-09-12 DIAGNOSIS — L600 Ingrowing nail: Secondary | ICD-10-CM

## 2021-09-12 NOTE — Progress Notes (Signed)
? ?  Subjective: ?Patient presents today for evaluation of pain to the lateral border right great toe. Patient is concerned for possible ingrown nail.  It is very sensitive to touch.  This has been ongoing now for about 3 weeks. Patient presents today for further treatment and evaluation. ? ?Past Medical History:  ?Diagnosis Date  ? Atrial fibrillation (HCC)   ? ?No past surgical history on file. ?Allergies  ?Allergen Reactions  ? Sulfa Antibiotics   ? ? ?Objective:  ?General: Well developed, nourished, in no acute distress, alert and oriented x3  ? ?Dermatology: Skin is warm, dry and supple bilateral. Lateral border right great toe appears to be erythematous with evidence of an ingrowing nail. Pain on palpation noted to the border of the nail fold. The remaining nails appear unremarkable at this time. There are no open sores, lesions. ? ?Vascular: Dorsalis Pedis artery and Posterior Tibial artery pedal pulses palpable. No lower extremity edema noted.  ? ?Neruologic: Grossly intact via light touch bilateral. ? ?Musculoskeletal: No pedal deformity noted.  ? ?Assesement: ?#1 Paronychia with ingrowing nail lateral border right great toe ?#2 Pain in toe ? ?Plan of Care:  ?1. Patient evaluated.  ?2. Discussed treatment alternatives and plan of care. Patient has never had an ingrown toenail prior to this event. We will pursue conservative care for now.  ?3. The offending border of the nail was sharply debrided away and the patient felt significant relief.  ?4. Recommend silvadene cream daily for the next week or so.  ?5. Return to clinic prn ? ?Felecia Shelling, DPM ?[facility] ? ?Dr. Felecia Shelling, DPM  ?  ?[address]                                       ?[place], [place] [address]                ?Office [phone]  ?Fax [phone] ? ? ?  ?

## 2021-10-02 ENCOUNTER — Ambulatory Visit: Payer: Medicare Other | Admitting: Dermatology

## 2021-10-10 ENCOUNTER — Other Ambulatory Visit: Payer: Self-pay

## 2021-10-10 ENCOUNTER — Encounter
Admission: RE | Admit: 2021-10-10 | Discharge: 2021-10-10 | Disposition: A | Discharge status: Home / Self Care | Payer: Medicare Other | Source: Ambulatory Visit | Attending: Urology | Admitting: Urology

## 2021-10-10 DIAGNOSIS — Z01812 Encounter for preprocedural laboratory examination: Secondary | ICD-10-CM

## 2021-10-10 NOTE — Patient Instructions (Addendum)
Your procedure is scheduled on: 10/19/21 - Thursday Report to the Registration Desk on the 1st floor of the [facility]. To find out your arrival time, please call [phone] between 1PM - 3PM on: 10/18/21 - Wednesday If your arrival time is 6:00 am, do not arrive prior to that time as the [facility] entrance doors do not open until 6:00 am.  REMEMBER: Instructions that are not followed completely may result in serious medical risk, up to and including death; or upon the discretion of your surgeon and anesthesiologist your surgery may need to be rescheduled.  Do not eat food after midnight the night before surgery.  No gum chewing, lozengers or hard candies.  You may however, drink CLEAR liquids up to 2 hours before you are scheduled to arrive for your surgery. Do not drink anything within 2 hours of your scheduled arrival time.  Clear liquids include: - water  - apple juice without pulp - gatorade (not RED colors) - black coffee or tea (Do NOT add milk or creamers to the coffee or tea) Do NOT drink anything that is not on this list.  TAKE THESE MEDICATIONS THE MORNING OF SURGERY WITH A SIP OF WATER:  - brimonidine (ALPHAGAN) 0.2 % ophthalmic solution - tamsulosin (FLOMAX) 0.4 MG CAPS capsule - metoprolol succinate (TOPROL-XL) 25 MG 24 hr tablet   Follow recommendations from Cardiologist, Pulmonologist or PCP regarding stopping Aspirin, Coumadin, Plavix, Eliquis, Pradaxa, or Pletal. Stop taking Eliquis 5 days prior to your procedure beginning 10/14/21.  One week prior to surgery: Stop Anti-inflammatories (NSAIDS) such as Advil, Aleve, Ibuprofen, Motrin, Naproxen, Naprosyn and Aspirin based products such as Excedrin, Goodys Powder, BC Powder.  Stop ANY OVER THE COUNTER supplements until after surgery.  You may however, continue to take Tylenol if needed for pain up until the day of surgery.  No Alcohol for 24 hours before or after surgery.  No Smoking including  e-cigarettes for 24 hours prior to surgery.  No chewable tobacco products for at least 6 hours prior to surgery.  No nicotine patches on the day of surgery.  Do not use any "recreational" drugs for at least a week prior to your surgery.  Please be advised that the combination of cocaine and anesthesia may have negative outcomes, up to and including death. If you test positive for cocaine, your surgery will be cancelled.  On the morning of surgery brush your teeth with toothpaste and water, you may rinse your mouth with mouthwash if you wish. Do not swallow any toothpaste or mouthwash.  Do not wear jewelry, make-up, hairpins, clips or nail polish.  Do not wear lotions, powders, or perfumes.   Do not shave body from the neck down 48 hours prior to surgery just in case you cut yourself which could leave a site for infection.  Also, freshly shaved skin may become irritated if using the CHG soap.  Contact lenses, hearing aids and dentures may not be worn into surgery.  Do not bring valuables to the hospital. [facility] is not responsible for any missing/lost belongings or valuables.   Notify your doctor if there is any change in your medical condition (cold, fever, infection).  Wear comfortable clothing (specific to your surgery type) to the hospital.  After surgery, you can help prevent lung complications by doing breathing exercises.  Take deep breaths and cough every 1-2 hours. Your doctor may order a device called an Incentive Spirometer to help you take deep breaths. When coughing or sneezing,  hold a pillow firmly against your incision with both hands. This is called "splinting." Doing this helps protect your incision. It also decreases belly discomfort.  If you are being admitted to the hospital overnight, leave your suitcase in the car. After surgery it may be brought to your room.  If you are being discharged the day of surgery, you will not be allowed to drive home. You will  need a responsible adult (18 years or older) to drive you home and stay with you that night.   If you are taking public transportation, you will need to have a responsible adult (18 years or older) with you. Please confirm with your physician that it is acceptable to use public transportation.   Please call the [facility] at [phone] if you have any questions about these instructions.  Surgery Visitation Policy:  Patients undergoing a surgery or procedure may have two family members or support persons with them as long as the person is not COVID-19 positive or experiencing its symptoms.   Inpatient Visitation:    Visiting hours are 7 a.m. to 8 p.m. Up to four visitors are allowed at one time in a patient room, including children. The visitors may rotate out with other people during the day. One designated support person (adult) may remain overnight.

## 2021-10-11 ENCOUNTER — Encounter: Payer: Self-pay | Admitting: Urgent Care

## 2021-10-11 ENCOUNTER — Encounter
Admission: RE | Admit: 2021-10-11 | Discharge: 2021-10-11 | Disposition: A | Discharge status: Home / Self Care | Payer: Medicare Other | Source: Ambulatory Visit | Attending: Urology | Admitting: Urology

## 2021-10-11 DIAGNOSIS — Z01818 Encounter for other preprocedural examination: Secondary | ICD-10-CM | POA: Insufficient documentation

## 2021-10-11 DIAGNOSIS — Z01812 Encounter for preprocedural laboratory examination: Secondary | ICD-10-CM

## 2021-10-11 LAB — BASIC METABOLIC PANEL
Anion gap: 3 — ABNORMAL LOW (ref 5–15)
BUN: 19 mg/dL (ref 8–23)
CO2: 29 mmol/L (ref 22–32)
Calcium: 9.5 mg/dL (ref 8.9–10.3)
Chloride: 105 mmol/L (ref 98–111)
Creatinine, Ser: 1.26 mg/dL — ABNORMAL HIGH (ref 0.61–1.24)
GFR, Estimated: 55 mL/min — ABNORMAL LOW (ref >60)
Glucose, Bld: 88 mg/dL (ref 70–99)
Potassium: 4.4 mmol/L (ref 3.5–5.1)
Sodium: 137 mmol/L (ref 135–145)

## 2021-10-11 LAB — CBC
HCT: 45.8 % (ref 39.0–52.0)
Hemoglobin: 15 g/dL (ref 13.0–17.0)
MCH: 30.2 pg (ref 26.0–34.0)
MCHC: 32.8 g/dL (ref 30.0–36.0)
MCV: 92.2 fL (ref 80.0–100.0)
Platelets: 212 10*3/uL (ref 150–400)
RBC: 4.97 MIL/uL (ref 4.22–5.81)
RDW: 13.2 % (ref 11.5–15.5)
WBC: 6 10*3/uL (ref 4.0–10.5)
nRBC: 0 % (ref 0.0–0.2)

## 2021-10-17 ENCOUNTER — Encounter: Payer: Self-pay | Admitting: Urology

## 2021-10-17 NOTE — Progress Notes (Signed)
Perioperative Services  Pre-Admission/Anesthesia Testing Clinical Review  Date: 10/17/21  Patient Demographics:  Name: Johnathan Byrd DOB:   November 21, 1930 MRN:   [id number]  Planned Surgical Procedure(s):    Case: [id number] Date/Time: 10/19/21 0715   Procedure: GREEN LIGHT LASER TURP (TRANSURETHRAL RESECTION OF PROSTATE)   Anesthesia type: Choice   Pre-op diagnosis: prostate   Location: [facility] OR ROOM 10 / [place] [facility] FOR ANESTHESIA GROUP   Surgeons: Royston Cowper, MD   NOTE: Available PAT nursing documentation and vital signs have been reviewed. Clinical nursing staff has updated patient's PMH/PSHx, current medication list, and drug allergies/intolerances to ensure comprehensive history available to assist in medical decision making as it pertains to the aforementioned surgical procedure and anticipated anesthetic course. Extensive review of available clinical information performed. [place] PMH and PSHx updated with any diagnoses/procedures that  may have been inadvertently omitted during his intake with the pre-admission testing department's nursing staff.  Clinical Discussion:  Johnathan Byrd is a 88 y.o. male who is submitted for pre-surgical anesthesia review and clearance prior to him undergoing the above procedure. Patient has never been a smoker. Pertinent PMH includes: atrial fibrillation, CHF, IRBBB, CKD-III, BPH, BPPV.   Patient is followed by cardiology Donnamarie Poag, MD). He was last seen in the cardiology clinic on 07/10/2021; notes reviewed.  At the time of this clinic visit, patient doing well overall from a cardiovascular perspective.  He denied any episodes of chest pain, shortness breath, PND, orthopnea, palpitations, significant peripheral edema, vertiginous symptoms, or presyncope/syncope.  Patient with past medical history significant for cardiovascular diagnoses.  Myocardial perfusion imaging study performed on 06/05/2015 revealed a normal left ventricular systolic function  with a hyperdynamic LVEF of 79%.  There were no regional wall motion abnormalities.  Baseline ECG revealed atrial fibrillation and an IRBBB.  There was no evidence of stress-induced myocardial ischemia; no scintigraphic evidence of scar.  Long-term cardiac event monitor study was performed on 09/21/2020 revealing a predominant underlying atrial fibrillation with a 100% burden ranging from 44-141 bpm (average 77 bpm).  There were no significant sustained pauses noted.  Isolated VE and VE couplets were rare (<1.0%); no VE triplets present.  Last TTE was performed on 07/10/2021 revealing a normal left ventricular systolic function with an EF of 65%.  There was moderate to severe biatrial enlargement.  There was trivial aortic and mild pulmonic, mitral, and tricuspid valve regurgitation.  There was no significant transvalvular gradient to suggest stenosis.  As previously mentioned, patient with an atrial fibrillation diagnosis; CHA2DS2-VASc Score = 4 (age x 2, HTN, CHF).  Rate and rhythm maintained on oral metoprolol succinate.  Patient chronically anticoagulated using standard dose apixaban; compliant with therapy with no evidence or reports of GI bleeding.  Blood pressure slightly elevated at 147/90 on currently prescribed beta-blocker monotherapy.  Patient is not on any type of lipid-lowering therapies for ASCVD prevention.  He is not diabetic. Functional capacity, as defined by DASI, is documented as being >/= 4 METS.  Given age and creatinine >1.5, apixaban dose was reduced to 2.5 mg twice daily. Functional capacity, as defined by DASI, is documented as being >/= 4 METS.  No other changes were made to his medication regimen.  Patient to continue regular scheduled follow-up with outpatient cardiology for ongoing management of his cardiovascular diagnoses.  Johnathan Byrd is scheduled for an GREEN LIGHT LASER TRANSURETHRAL RESECTION OF PROSTATE on 10/19/2021 with Dr. Maryan Puls, MD.  Given patient's past  medical history significant for cardiovascular  diagnoses, presurgical cardiac clearance was sought by PAT team. Per cardiology, "this patient is optimized for surgery and may proceed with the planned procedural course with a Low to MODERATE risk of significant perioperative cardiovascular complications". Again, this patient is on daily anticoagulation therapy.  He has been instructed on recommendations for from his cardiologist and/or surgeon for holding his apixaban for 5 days prior to his procedure with plans to restart as soon as postoperatively respectively advised by his primary attending surgeon.  Patient is aware that his last dose of apixaban should be on 10/13/2021.  Patient denies previous perioperative complications with anesthesia in the past. In review of the EMR, there are no records available for review regarding patient's past surgical/anesthetic courses within the [facility] system.     09/08/2021    7:16 AM 07/16/2021   10:35 AM 07/16/2021    9:27 AM  Vitals with BMI  Height   5\' 8"   Weight 184 lbs  182 lbs  BMI   0000000  Systolic  [id number]  Diastolic  123XX123 95  Pulse  78 81    Providers/Specialists:   NOTE: Primary physician provider listed below. Patient may have been seen by APP or partner within same practice.   PROVIDER ROLE / SPECIALTY LAST Towana Badger, MD Urology (Surgeon) 09/21/2021  Derinda Late, MD Primary Care Provider 08/31/2021  Elisabeth Cara, MD Cardiology 07/10/2021   Allergies:  Sulfa antibiotics  Current Home Medications:   No current facility-administered medications for this encounter.    acetaminophen (TYLENOL) 500 MG tablet   apixaban (ELIQUIS) 5 MG TABS tablet   brimonidine (ALPHAGAN) 0.2 % ophthalmic solution   Cyanocobalamin (VITAMIN B-12) 2500 MCG SUBL   finasteride (PROSCAR) 5 MG tablet   ketoconazole (NIZORAL) 2 % shampoo   metoprolol succinate (TOPROL-XL) 25 MG 24 hr tablet   tamsulosin (FLOMAX) 0.4 MG CAPS capsule   terazosin  (HYTRIN) 10 MG capsule   History:   Past Medical History:  Diagnosis Date   Atrial fibrillation (HCC)    a.) CHA2DS2-VASc = 4 (age x 2, CHF, HTN). b.) rate/rhythm maintained on oral metoprolol succinate; chronically anticoagulated with apixaban.   B12 deficiency    BPH associated with nocturia    BPPV (benign paroxysmal positional vertigo)    CHF (congestive heart failure) (HCC)    CKD (chronic kidney disease), stage III (HCC)    Glaucoma    Incomplete right bundle branch block (RBBB)    Long term current use of anticoagulant    a.) apixaban   Macular degeneration    Past Surgical History:  Procedure Laterality Date   COLONOSCOPY W/ POLYPECTOMY     Family History  Problem Relation Age of Onset   Alzheimer's disease Mother    Stroke Father    Social History   Tobacco Use   Smoking status: Never   Smokeless tobacco: Never  Substance Use Topics   Alcohol use: Never   Drug use: Never    Pertinent Clinical Results:  LABS: Labs reviewed: Acceptable for surgery.  Component Date Value Ref Range Status   WBC 10/11/2021 6.0  4.0 - 10.5 K/uL Final   RBC 10/11/2021 4.97  4.22 - 5.81 MIL/uL Final   Hemoglobin 10/11/2021 15.0  13.0 - 17.0 g/dL Final   HCT 10/11/2021 45.8  39.0 - 52.0 % Final   MCV 10/11/2021 92.2  80.0 - 100.0 fL Final   MCH 10/11/2021 30.2  26.0 - 34.0 pg Final   MCHC 10/11/2021  32.8  30.0 - 36.0 g/dL Final   RDW 10/11/2021 13.2  11.5 - 15.5 % Final   Platelets 10/11/2021 212  150 - 400 K/uL Final   nRBC 10/11/2021 0.0  0.0 - 0.2 % Final   Performed at [facility], [place], [place] [address]   Sodium 10/11/2021 137  135 - 145 mmol/L Final   Potassium 10/11/2021 4.4  3.5 - 5.1 mmol/L Final   Chloride 10/11/2021 105  98 - 111 mmol/L Final   CO2 10/11/2021 29  22 - 32 mmol/L Final   Glucose, Bld 10/11/2021 88  70 - 99 mg/dL Final   Glucose reference range applies only to samples taken after fasting for at least 8 hours.   BUN  10/11/2021 19  8 - 23 mg/dL Final   Creatinine, Ser 10/11/2021 1.26 (H)  0.61 - 1.24 mg/dL Final   Calcium 10/11/2021 9.5  8.9 - 10.3 mg/dL Final   GFR, Estimated 10/11/2021 55 (L)  >60 mL/min Final   Comment: (NOTE) Calculated using the CKD-EPI Creatinine Equation (2021)    Anion gap 10/11/2021 3 (L)  5 - 15 Final   Performed at [facility], [address] [place], [place] [address]    ECG: Date: 10/11/2021 Time ECG obtained: 1045 AM Rate: 72 bpm Rhythm: atrial fibrillation Axis (leads I and aVF): Normal Intervals: QRS 86 ms. QTc 433 ms. ST segment and T wave changes: No evidence of acute ST segment elevation or depression Comparison: Similar to previous tracing obtained on 11/20/2019   IMAGING / PROCEDURES: MRI PROSTATE W WO CONTRAST performed on 09/08/2021 Small PI-RADS category 3 lesion in the right posterior transition zone. Targeting data sent to [place]. Marked prostatomegaly.  Benign prostatic hypertrophy. Linear accentuated enhancement in the right lower perianal region could be from focal inflammation but may be vascular or incidental.  TRANSTHORACIC ECHOCARDIOGRAM performed on 07/10/2018 Normal left ventricular systolic function with an EF of 65% Right ventricle normal in size with normal systolic function Biatrial dilatation No significant valvular abnormalities  No significant change from previous study performed on 03/10/2018  LONG TERM CARDIAC EVENT MONITOR STUDY performed on 09/21/2020 Predominant underlying atrial fibrillation with a 100% burden ranging from 44-141 bpm (average 77 bpm).   There were no significant sustained pauses noted.   Isolated VE and VE couplets were rare (<1.0%); no VE triplets present.  MYOCARDIAL PERFUSION IMAGING STUDY (LEXISCAN) performed on 02/03/2016 Myocardial perfusion imaging is normal. Overall left ventricular systolic function was normal without regional wall motion abnormalities.  Ejection fraction >70%.   Negative EKG response to Lexiscan.  No prior study to compare.    Impression and Plan:  Johnathan Byrd has been referred for pre-anesthesia review and clearance prior to him undergoing the planned anesthetic and procedural courses. Available labs, pertinent testing, and imaging results were personally reviewed by me. This patient has been appropriately cleared by cardiology with an overall LOW to MODERATE risk of significant perioperative cardiovascular complications.  Based on clinical review performed today (10/17/21), barring any significant acute changes in the patient's overall condition, it is anticipated that he will be able to proceed with the planned surgical intervention. Any acute changes in clinical condition may necessitate his procedure being postponed and/or cancelled. Patient will meet with anesthesia team (MD and/or CRNA) on the day of his procedure for preoperative evaluation/assessment. Questions regarding anesthetic course will be fielded at that time.   Pre-surgical instructions were reviewed with the patient during his PAT appointment  and questions were fielded by PAT clinical staff. Patient was advised that if any questions or concerns arise prior to his procedure then he should return a call to PAT and/or his surgeon's office to discuss.  Honor Loh, MSN, APRN, FNP-C, CEN [facility]  Peri-operative Services Nurse Practitioner Phone: [phone] Fax: [phone] 10/17/21 4:01 PM  NOTE: This note has been prepared using Dragon dictation software. Despite my best ability to proofread, there is always the potential that unintentional transcriptional errors may still occur from this process.

## 2021-10-18 MED ORDER — CHLORHEXIDINE GLUCONATE 0.12 % MT SOLN: 15.0000 mL | Freq: Once | OROMUCOSAL | Status: AC

## 2021-10-18 MED ORDER — LACTATED RINGERS IV SOLN: INTRAVENOUS | Status: DC

## 2021-10-18 MED ORDER — ORAL CARE MOUTH RINSE: 15.0000 mL | Freq: Once | OROMUCOSAL | Status: AC

## 2021-10-18 MED ORDER — CEFAZOLIN SODIUM-DEXTROSE 1-4 GM/50ML-% IV SOLN
1.0000 g | Freq: Once | INTRAVENOUS | Status: AC
  Administered 2021-10-19: 2 g via INTRAVENOUS

## 2021-10-18 NOTE — H&P (Signed)
Johnathan Byrd, Johnathan Byrd MEDICAL RECORD NO: [id number] ACCOUNT NO: [id number] DATE OF BIRTH: [birth year] FACILITY: [facility] LOCATION: [facility] PHYSICIAN: Suszanne Conners. Evelene Croon, MD  History and Physical   DATE OF ADMISSION: 09/21/2021  CHIEF COMPLAINT:  Difficulty voiding and urinary retention.  HISTORY OF PRESENT ILLNESS:  The patient is an 88 year old white male who developed urinary retention several months ago, but wasable to void since being placed on medication.  He has significant voiding symptoms with an IPS score of 25.  He is currently on  finasteride and tamsulosin.  Evaluation in the office included a PSA of 14.6 ng/mL with a subsequent prostate MRI revealing 193 gram prostate with a PI-RADS category 3 lesion on the right side.  The patient has chosen not to proceed with prostate biopsy.   He had a Uroflow study in April, indicating a maximum flow rate of 7 mL per second with an average flow rate of 2.9 mL per second based upon a voided volume of 140 mL. Cystoscopy indicated lateral lobe prostatic hypertrophy with a 5 cm prostatic  urethral length.  He comes in now for photovaporization of the prostate with the GreenLight laser.  PAST MEDICAL HISTORY: ALLERGIES:  INCLUDE SULFA DRUGS.  CURRENT MEDICATIONS:  Finasteride, tamsulosin, brimonidine, Eliquis, metoprolol, vitamin D, zinc, vitamin B12.  PAST SURGICAL HISTORY:  1.  Macular degeneration surgery in 1995. 2.  Laser vaporization of the prostate, 2003. 3.  Multiple prostate biopsies, 2002, 2007 and 2012, which were all benign.  PAST AND CURRENT MEDICAL CONDITIONS:  1.  BPH with LUTS. 2.  Macular degeneration. 3.  Atrial fibrillation. 4.  History of elevated PSA with benign biopsies.  REVIEW OF SYSTEMS:  The patient has chronic constipation.  He also has chronic insomnia related to his nocturia.  He denied chest pain, shortness of breath, diabetes or stroke.  PHYSICAL EXAMINATION:   VITAL SIGNS:  Weight 184 pounds, height 5  feet 6 inches, BMI 30. GENERAL:  Well-nourished white male in no acute distress. HEENT:  Sclerae were clear.  Pupils are equally round, reactive to light and accommodation.  Extraocular movements were intact. NECK:  No palpable masses or tenderness.  No audible carotid bruits. LYMPHATIC:  No palpable cervical or inguinal adenopathy. PULMONARY:  Lungs clear to auscultation. CARDIOVASCULAR:  Regular rhythm and rate. ABDOMEN:  Soft, nontender abdomen. GENITOURINARY:  Uncircumcised.  Testes smooth, nontender, 20 mL size each.  He has a small right hydrocele. RECTAL:  Greater than 50 gram prostate with TURP defect. NEUROMUSCULAR:  Alert and oriented x3.  IMPRESSION:  1.  BPH with LUTS. 2.  Recent urinary retention.  PLAN:  Photovaporization of prostate with GreenLight laser.   SHY D: 10/12/2021 2:13:09 pm T: 10/12/2021 3:21:00 pm  JOB: [id number]/ [id number]

## 2021-10-19 ENCOUNTER — Encounter: Payer: Self-pay | Admitting: Urology

## 2021-10-19 ENCOUNTER — Ambulatory Visit: Payer: Medicare Other | Admitting: Urgent Care

## 2021-10-19 ENCOUNTER — Encounter
Admission: RE | Disposition: A | Discharge status: Home / Self Care | Payer: Self-pay | Source: Home / Self Care | Attending: Urology

## 2021-10-19 ENCOUNTER — Other Ambulatory Visit: Payer: Self-pay

## 2021-10-19 ENCOUNTER — Ambulatory Visit
Admission: RE | Admit: 2021-10-19 | Discharge: 2021-10-19 | Disposition: A | Discharge status: Home / Self Care | Payer: Medicare Other | Attending: Urology | Admitting: Urology

## 2021-10-19 DIAGNOSIS — N183 Chronic kidney disease, stage 3 unspecified: Secondary | ICD-10-CM | POA: Diagnosis not present

## 2021-10-19 DIAGNOSIS — N320 Bladder-neck obstruction: Secondary | ICD-10-CM | POA: Insufficient documentation

## 2021-10-19 DIAGNOSIS — N138 Other obstructive and reflux uropathy: Secondary | ICD-10-CM | POA: Insufficient documentation

## 2021-10-19 DIAGNOSIS — I509 Heart failure, unspecified: Secondary | ICD-10-CM | POA: Diagnosis not present

## 2021-10-19 DIAGNOSIS — N401 Enlarged prostate with lower urinary tract symptoms: Secondary | ICD-10-CM | POA: Insufficient documentation

## 2021-10-19 DIAGNOSIS — I4891 Unspecified atrial fibrillation: Secondary | ICD-10-CM | POA: Diagnosis not present

## 2021-10-19 DIAGNOSIS — H8110 Benign paroxysmal vertigo, unspecified ear: Secondary | ICD-10-CM | POA: Insufficient documentation

## 2021-10-19 DIAGNOSIS — Z79899 Other long term (current) drug therapy: Secondary | ICD-10-CM | POA: Diagnosis not present

## 2021-10-19 DIAGNOSIS — R338 Other retention of urine: Secondary | ICD-10-CM | POA: Insufficient documentation

## 2021-10-19 HISTORY — DX: Deficiency of other specified B group vitamins: E53.8

## 2021-10-19 HISTORY — DX: Unspecified macular degeneration: H35.30

## 2021-10-19 HISTORY — PX: GREEN LIGHT LASER TURP (TRANSURETHRAL RESECTION OF PROSTATE: SHX6260

## 2021-10-19 HISTORY — DX: Long term (current) use of anticoagulants: Z79.01

## 2021-10-19 HISTORY — DX: Unspecified right bundle-branch block: I45.10

## 2021-10-19 HISTORY — DX: Benign prostatic hyperplasia with lower urinary tract symptoms: N40.1

## 2021-10-19 HISTORY — DX: Chronic kidney disease, stage 3 unspecified: N18.30

## 2021-10-19 HISTORY — DX: Benign paroxysmal vertigo, unspecified ear: H81.10

## 2021-10-19 HISTORY — DX: Heart failure, unspecified: I50.9

## 2021-10-19 HISTORY — DX: Unspecified glaucoma: H40.9

## 2021-10-19 SURGERY — GREEN LIGHT LASER TURP (TRANSURETHRAL RESECTION OF PROSTATE
Anesthesia: General | Site: Prostate

## 2021-10-19 MED ORDER — DOCUSATE SODIUM 100 MG PO CAPS: 200.0000 mg | ORAL_CAPSULE | Freq: Two times a day (BID) | ORAL | 3 refills | Status: DC

## 2021-10-19 MED ORDER — ONDANSETRON HCL 4 MG/2ML IJ SOLN
INTRAMUSCULAR | Status: DC | PRN
  Administered 2021-10-19: 4 mg via INTRAVENOUS

## 2021-10-19 MED ORDER — CHLORHEXIDINE GLUCONATE 0.12 % MT SOLN
OROMUCOSAL | Status: AC
  Administered 2021-10-19: 15 mL via OROMUCOSAL
  Filled 2021-10-19: qty 15

## 2021-10-19 MED ORDER — PHENYLEPHRINE HCL (PRESSORS) 10 MG/ML IV SOLN
INTRAVENOUS | Status: DC | PRN
  Administered 2021-10-19 (×3): 80 ug via INTRAVENOUS

## 2021-10-19 MED ORDER — CEFAZOLIN SODIUM-DEXTROSE 1-4 GM/50ML-% IV SOLN
INTRAVENOUS | Status: AC
  Filled 2021-10-19: qty 50

## 2021-10-19 MED ORDER — PROPOFOL 10 MG/ML IV BOLUS
INTRAVENOUS | Status: DC | PRN
  Administered 2021-10-19: 120 mg via INTRAVENOUS
  Administered 2021-10-19: 20 mg via INTRAVENOUS

## 2021-10-19 MED ORDER — LIDOCAINE HCL (PF) 2 % IJ SOLN
INTRAMUSCULAR | Status: AC
Start: 2021-10-19 — End: ?
  Filled 2021-10-19: qty 5

## 2021-10-19 MED ORDER — LIDOCAINE HCL (CARDIAC) PF 100 MG/5ML IV SOSY
PREFILLED_SYRINGE | INTRAVENOUS | Status: DC | PRN
  Administered 2021-10-19: 50 mg via INTRAVENOUS

## 2021-10-19 MED ORDER — DEXMEDETOMIDINE (PRECEDEX) IN NS 20 MCG/5ML (4 MCG/ML) IV SYRINGE
PREFILLED_SYRINGE | INTRAVENOUS | Status: DC | PRN
  Administered 2021-10-19 (×3): 4 ug via INTRAVENOUS

## 2021-10-19 MED ORDER — LIDOCAINE HCL URETHRAL/MUCOSAL 2 % EX GEL
CUTANEOUS | Status: DC | PRN
  Administered 2021-10-19: 1 via URETHRAL

## 2021-10-19 MED ORDER — LIDOCAINE HCL URETHRAL/MUCOSAL 2 % EX GEL
CUTANEOUS | Status: AC
  Filled 2021-10-19: qty 10

## 2021-10-19 MED ORDER — FENTANYL CITRATE (PF) 100 MCG/2ML IJ SOLN: 25.0000 ug | INTRAMUSCULAR | Status: DC | PRN

## 2021-10-19 MED ORDER — CEPHALEXIN 500 MG PO CAPS: 500.0000 mg | ORAL_CAPSULE | Freq: Four times a day (QID) | ORAL | 0 refills | Status: DC

## 2021-10-19 MED ORDER — FENTANYL CITRATE (PF) 100 MCG/2ML IJ SOLN
INTRAMUSCULAR | Status: DC | PRN
  Administered 2021-10-19 (×2): 25 ug via INTRAVENOUS
  Administered 2021-10-19: 50 ug via INTRAVENOUS

## 2021-10-19 MED ORDER — ACETAMINOPHEN 10 MG/ML IV SOLN
INTRAVENOUS | Status: DC | PRN
  Administered 2021-10-19: 1000 mg via INTRAVENOUS

## 2021-10-19 MED ORDER — DEXAMETHASONE SODIUM PHOSPHATE 10 MG/ML IJ SOLN
INTRAMUSCULAR | Status: DC | PRN
  Administered 2021-10-19: 5 mg via INTRAVENOUS

## 2021-10-19 MED ORDER — ONDANSETRON HCL 4 MG/2ML IJ SOLN: 4.0000 mg | Freq: Once | INTRAMUSCULAR | Status: DC | PRN

## 2021-10-19 MED ORDER — PROPOFOL 10 MG/ML IV BOLUS
INTRAVENOUS | Status: AC
  Filled 2021-10-19: qty 20

## 2021-10-19 MED ORDER — FENTANYL CITRATE (PF) 100 MCG/2ML IJ SOLN
INTRAMUSCULAR | Status: AC
  Filled 2021-10-19: qty 2

## 2021-10-19 MED ORDER — SODIUM CHLORIDE 0.9 % IR SOLN
Status: DC | PRN
  Administered 2021-10-19 (×7): 3000 mL

## 2021-10-19 SURGICAL SUPPLY — 29 items
ADAPTER IRRIG TUBE 2 SPIKE SOL (ADAPTER) ×4 IMPLANT
ADPR TBG 2 SPK PMP STRL ASCP (ADAPTER) ×2
BAG DRN RND TRDRP ANRFLXCHMBR (UROLOGICAL SUPPLIES) ×1
BAG URINE DRAIN 2000ML AR STRL (UROLOGICAL SUPPLIES) ×2 IMPLANT
CATH FOLEY 2WAY  5CC 20FR SIL (CATHETERS)
CATH FOLEY 2WAY 5CC 20FR SIL (CATHETERS) IMPLANT
CYSTOSCOPE CON FLOW (MISCELLANEOUS) ×2 IMPLANT
FEE RENTAL LASER GREENLIGHT (Laser) ×1 IMPLANT
GAUZE 4X4 16PLY ~~LOC~~+RFID DBL (SPONGE) ×4 IMPLANT
GLOVE BIO SURGEON STRL SZ7.5 (GLOVE) ×2 IMPLANT
GOWN STRL REUS W/ TWL LRG LVL3 (GOWN DISPOSABLE) ×1 IMPLANT
GOWN STRL REUS W/ TWL XL LVL3 (GOWN DISPOSABLE) ×1 IMPLANT
GOWN STRL REUS W/TWL LRG LVL3 (GOWN DISPOSABLE) ×2
GOWN STRL REUS W/TWL XL LVL3 (GOWN DISPOSABLE) ×2
IV NS 1000ML (IV SOLUTION) ×2
IV NS 1000ML BAXH (IV SOLUTION) ×1 IMPLANT
IV NS IRRIG 3000ML ARTHROMATIC (IV SOLUTION) ×10 IMPLANT
IV SET PRIMARY 15D 139IN B9900 (IV SETS) ×2 IMPLANT
KIT TURNOVER CYSTO (KITS) ×2 IMPLANT
LASER FIBER /GREENLIGHT LASER (Laser) ×1 IMPLANT
LASER GREENLIGHT RENTAL P/PROC (Laser) ×2 IMPLANT
MANIFOLD NEPTUNE II (INSTRUMENTS) ×2 IMPLANT
PACK CYSTO AR (MISCELLANEOUS) ×2 IMPLANT
SET IRRIG Y TYPE TUR BLADDER L (SET/KITS/TRAYS/PACK) ×2 IMPLANT
SURGILUBE 2OZ TUBE FLIPTOP (MISCELLANEOUS) ×2 IMPLANT
SYR TOOMEY IRRIG 70ML (MISCELLANEOUS) ×2
SYRINGE TOOMEY IRRIG 70ML (MISCELLANEOUS) ×1 IMPLANT
WATER STERILE IRR 1000ML POUR (IV SOLUTION) ×2 IMPLANT
WATER STERILE IRR 500ML POUR (IV SOLUTION) ×2 IMPLANT

## 2021-10-19 NOTE — Anesthesia Preprocedure Evaluation (Signed)
Anesthesia Evaluation  Patient identified by MRN, date of birth, ID band Patient awake    Reviewed: Allergy & Precautions, H&P , NPO status , Patient's Chart, lab work & pertinent test results, reviewed documented beta blocker date and time   Airway Mallampati: III  TM Distance: >3 FB Neck ROM: full    Dental  (+) Teeth Intact   Pulmonary neg pulmonary ROS,    Pulmonary exam normal        Cardiovascular Exercise Tolerance: Good +CHF  + dysrhythmias Atrial Fibrillation  Rate:Normal     Neuro/Psych negative neurological ROS  negative psych ROS   GI/Hepatic negative GI ROS, Neg liver ROS,   Endo/Other  negative endocrine ROS  Renal/GU Renal disease  negative genitourinary   Musculoskeletal   Abdominal   Peds  Hematology negative hematology ROS (+)   Anesthesia Other Findings   Reproductive/Obstetrics negative OB ROS                             Anesthesia Physical Anesthesia Plan  ASA: 3  Anesthesia Plan: General LMA   Post-op Pain Management:    Induction:   PONV Risk Score and Plan:   Airway Management Planned:   Additional Equipment:   Intra-op Plan:   Post-operative Plan:   Informed Consent: I have reviewed the patients History and Physical, chart, labs and discussed the procedure including the risks, benefits and alternatives for the proposed anesthesia with the patient or authorized representative who has indicated his/her understanding and acceptance.       Plan Discussed with: CRNA  Anesthesia Plan Comments:         Anesthesia Quick Evaluation

## 2021-10-19 NOTE — Anesthesia Procedure Notes (Signed)
Procedure Name: LMA Insertion Date/Time: 10/19/2021 10:23 AM  Performed by: Cheral Bay, CRNAPre-anesthesia Checklist: Patient identified, Emergency Drugs available, Suction available and Patient being monitored Patient Re-evaluated:Patient Re-evaluated prior to induction Oxygen Delivery Method: Circle system utilized Preoxygenation: Pre-oxygenation with 100% oxygen Induction Type: IV induction Ventilation: Mask ventilation without difficulty LMA: LMA inserted LMA Size: 4.0 Tube type: Oral Number of attempts: 1 Placement Confirmation: positive ETCO2 and breath sounds checked- equal and bilateral Tube secured with: Tape Dental Injury: Teeth and Oropharynx as per pre-operative assessment

## 2021-10-19 NOTE — Discharge Instructions (Addendum)
Johnathan Byrd Laser Prostate Treatment The male reproductive system, showing the prostate gland, urethra, and penis.   Green light laser treatment is a procedure that uses a high-energy laser to get rid of extra prostate tissue by turning the tissue into a vapor. It is less invasive than traditional methods of prostate surgery, which involve cutting out the prostate tissue. Because the tissue is turned into a vapor (vaporized) rather than cut out, there is generally less blood loss. This procedure may be done to treat an enlarged prostate gland, also called benign prostatic hyperplasia. Tell a health care provider about: Any allergies you have. All medicines you are taking, including vitamins, herbs, eye drops, creams, and over-the-counter medicines. Any problems you or family members have had with anesthetic medicines. Any bleeding problems you have. Any surgeries you have had. Any medical conditions you have. What are the risks? Generally, this is a safe procedure. However, problems may occur, including: Infection. Bleeding. Allergic reaction to medicines. Blood in the urine (hematuria). Urinary tract infection. Painful urination. Narrowing of the urethra. The urethra is the part of your body that drains urine from the bladder. The narrowing may block the flow of urine. Other problems may occur but are rare. They include: Damage to nearby structures or organs. Dry ejaculation, or having no semen come out during orgasm. Erectile dysfunction, or being unable to have or keep an erection. Deep vein thrombosis. This is a blood clot that can develop in your leg. What happens before the procedure? When to stop eating and drinking Clear liquid drinks, including water, tea, coffee, and juice.   Follow instructions from your health care provider about what you may eat and drink before your procedure. These may include: 8 hours before your procedure Stop eating most foods. Do not eat meat,  fried foods, or fatty foods. Eat only light foods, such as toast or crackers. All liquids are okay except energy drinks and alcohol. 6 hours before your procedure Stop eating. Drink only clear liquids, such as water, clear fruit juice, black coffee, plain tea, and sports drinks. Do not drink energy drinks or alcohol. 2 hours before your procedure Stop drinking all liquids. You may be allowed to take medicines with small sips of water. If you do not follow your health care provider's instructions, your procedure may be delayed or canceled. Medicines Ask your health care provider about: Changing or stopping your regular medicines. This is especially important if you are taking diabetes medicines or blood thinners. Taking medicines such as aspirin and ibuprofen. These medicines can thin your blood. Do not take these medicines unless your health care provider tells you to take them. Taking over-the-counter medicines, vitamins, herbs, and supplements. Surgery safety Ask your health care provider what steps will be taken to help prevent infection. These steps may include: Washing skin with a germ-killing soap. Taking antibiotic medicine. General instructions Do not use any products that contain nicotine or tobacco for at least 4 weeks before the procedure. These products include cigarettes, chewing tobacco, and vaping devices, such as e-cigarettes. If you need help quitting, ask your health care provider. If you will be going home right after the procedure, plan to have a responsible adult: Take you home from the hospital or clinic. You will not be allowed to drive. Care for you for the time you are told. What happens during the procedure? An IV will be inserted into one of your veins. You will be given one or more of the following: A medicine  to help you relax (sedative). A medicine to numb the area (local anesthetic). A medicine that is injected into your spine to numb the area below and  slightly above the injection site (spinal anesthetic). A medicine to make you fall asleep (general anesthetic). A tube containing viewing scopes and instruments (cystoscope) will be inserted through the urethra and into the prostate. The laser will be inserted through the cystoscope channel. Pulses of laser light will be directed onto the extra tissue. Your blood will absorb the light, become hot, and vaporize the extra prostate tissue. The heat from the laser beam will seal off the blood vessels, which will lessen bleeding. The cystoscope and laser will be removed. A thin, flexible tube called a urinary catheter will be inserted into your bladder to drain urine. The procedure may vary among health care providers and hospitals. What happens after the procedure? Your blood pressure, heart rate, breathing rate, and blood oxygen level will be monitored until you leave the hospital or clinic. You will be given fluids through the IV. The IV will be removed when you start eating and drinking normally. Your catheter may be removed. Removing your catheter depends on: The amount of prostate tissue that was vaporized. The strength of your bladder. The amount of bleeding that is expected. You may be given compression stockings to prevent blood clots and reduce swelling in your legs. Summary Green light laser treatment is a procedure that uses a high-energy laser that turns extra prostate tissue into a vapor. This procedure is less invasive than traditional methods of prostate surgery. Follow instructions from your health care provider about eating and drinking before the procedure. Plan to have a responsible adult take you home from the hospital or clinic and to care for you at home. This information is not intended to replace advice given to you by your health care provider. Make sure you discuss any questions you have with your health care provider. Document Revised: 01/20/2021 Document Reviewed:  01/20/2021 Elsevier Patient Education  [place] After The following information offers guidance on how to care for yourself after your procedure. Your health care provider may also give you more specific instructions. If you have problems or questions, contact your health care provider. What can I expect after the procedure? After the procedure, it is common to have these symptoms for a few days: Swelling and discomfort around your urethra. Blood in your urine. A burning feeling when you urinate after the urinary catheter is removed. You will feel this especially at the end of urination. This feeling usually passes within 3-5 days. For the first few weeks after the procedure, you may still feel: A sudden need to urinate (urgency). A need to urinate often. Follow these instructions at home: Medicines Take over-the-counter and prescription medicines only as told by your health care provider. These medicines may include stool softeners. If you were prescribed an antibiotic medicine, take it as told by your health care provider. Do not stop taking the antibiotic even if you start to feel better. Bathing Do not take baths, swim, or use a hot tub until your health care provider approves. Ask your health care provider if you may take showers. You may only be allowed to take sponge baths. Activity A sign showing that a person should not drive.   Rest as told by your health care provider. Do not drive or operate machinery until your health care provider says  that it is safe. Do not ride in a car for long periods of time, or as told by your health care provider. Do not do strenuous exercises for 1 week or as told by your health care provider. Strenuous exercises are exercises that require a lot of effort. Do not lift anything that is heavier than 10 lb (4.5 kg), or the limit that you are told, until your health care provider says that it is  safe. Avoid sex for 4-6 weeks, or as told by your health care provider. Return to your normal activities as told by your health care provider. Ask your health care provider what activities are safe for you. Preventing constipation You may need to take these actions to prevent or treat constipation: Drink enough fluid to keep your urine pale yellow. Take over-the-counter or prescription medicines. Eat foods that are high in fiber, such as beans, whole grains, and fresh fruits and vegetables. Limit foods that are high in fat and processed sugars, such as fried or sweet foods. General instructions Three cups showing dark yellow, yellow, and pale yellow urine.   Do not strain when you have a bowel movement. Straining may lead to bleeding from the prostate. This may cause blood clots and trouble urinating. Do not use any products that contain nicotine or tobacco. These products include cigarettes, chewing tobacco, and vaping devices, such as e-cigarettes. If you need help quitting, ask your health care provider. If you have a urinary catheter, care for it as told by your health care provider. Keep all follow-up visits. This is important. Contact a health care provider if: You have signs of infection, such as: Fever or chills. Swelling around your urethra that is getting worse. Urine that smells very bad. Struggling to urinate or pain or burning when you urinate. You have trouble having a bowel movement. You have blood in your urine for more than 2 days after the procedure. You have trouble having or keeping an erection. No semen comes out during orgasm (dry ejaculation). You have a urinary catheter still in place and you have: Spasms or pain. Problems with the catheter or your catheter is blocked. Get help right away if: You cannot urinate after your catheter is removed. Your urine is dark red or has blood clots in it. You have blood in your stool. You have severe pain that does not get  better with medicine. You develop swelling or pain in your leg. You develop chest pains or shortness of breath. These symptoms may be an emergency. Get help right away. Call 911. Do not wait to see if the symptoms will go away. Do not drive yourself to the hospital. Summary After the procedure, it is common to have swelling and discomfort around your urethra and blood in your urine for a few days. Some men may have problems urinating after this procedure. These problems should go away after a few days. If you have pain or burning while urinating, contact your health care provider. If you have a catheter after this procedure, care for it as told by your health care provider. If you have severe pain, dark red urine, or urine with blood clots, get medical help right away. This information is not intended to replace advice given to you by your health care provider. Make sure you discuss any questions you have with your health care provider. Document Revised: 01/20/2021 Document Reviewed: 01/20/2021 Elsevier Patient Education  2023 [employer]     Indwelling Urinary Catheter Care, Adult An  indwelling urinary catheter is a thin tube that is put into your bladder. The tube helps to drain pee (urine) out of your body. The tube goes in through your urethra. Your urethra is where pee comes out of your body. Your pee will come out through the catheter, then it will go into a bag (drainage bag). Take good care of your catheter so it will work well. What are the risks? Germs may get into your bladder and cause an infection. The tube can become blocked. Tissue near the catheter may become irritated and may bleed. How to wear your catheter and drainage bag Supplies needed Sticky tape (adhesive tape) or a leg strap. Alcohol wipe or soap and water (if you use tape). A clean towel (if you use tape). Large overnight bag. Smaller bag (leg bag). Wearing your catheter Attach your catheter to your leg  with tape or a leg strap. Make sure the catheter is not pulled tight. If a leg strap gets wet, take it off and put on a dry strap. If you use tape to hold the bag on your leg: Use an alcohol wipe or soap and water to wash your skin where the tape made it sticky before. Use a clean towel to pat-dry that skin. Use new tape to make the bag stay on your leg. Wearing your bags You should have been given a large overnight bag. You may wear the overnight bag in the day or night. Always have the overnight bag lower than your bladder.  Do not let the bag touch the floor. Before you go to sleep, put a clean plastic bag in a wastebasket. Then, hang the overnight bag inside the wastebasket. You should also have a smaller leg bag that fits under your clothes. Wear the leg bag as told by the product maker. This may be above or below the knee, depending on the length of the tubing. Make sure that the leg bag is below the bladder. Make sure that the tubing does not have loops or too much tension. Do not wear your leg bag at night. How to care for your skin and catheter Supplies needed A clean washcloth. Water and mild soap. A clean towel. Caring for your skin and catheter Male anatomy showing the labia, urethra, and an indwelling urinary catheter in the bladder.     Male anatomy showing the penis, urethra, and an indwelling urinary catheter in the bladder.   Clean the skin around your catheter every day. Wash your hands with soap and water. Wet a clean washcloth in warm water and mild soap. Clean the skin around your urethra. If you are male: Gently spread the folds of skin around your vagina (labia). With the washcloth in your other hand, wipe the inner side of your labia on each side. Wipe from front to back. If you are male: Pull back any skin that covers the end of your penis (foreskin). With the washcloth in your other hand, wipe your penis in small circles. Start wiping at the tip of  your penis, then move away from the catheter. Move the foreskin back in place, if needed. With your free hand, hold the catheter close to where it goes into your body. Keep holding the catheter during cleaning so it does not get pulled out. With the washcloth in your other hand, clean the catheter. Only wipe downward on the catheter, toward the drainage bag. Do not wipe upward toward your body. Doing this may push germs into your  urethra and cause infection. Use a clean towel to pat-dry the catheter and the skin around it. Make sure to wipe off all soap. Wash your hands with soap and water. Shower every day. Do not take baths. Do not use cream, ointment, or lotion on the area where the catheter goes into your body, unless your doctor tells you to. Do not use powders, sprays, or lotions on your genital area. Check your skin around the catheter every day for signs of infection. Check for: Redness, swelling, or pain. Fluid or blood. Warmth. Pus or a bad smell. How to empty the bag Supplies needed Rubbing alcohol. Gauze pad or cotton ball. Tape or a leg strap. Emptying the bag Pour the pee out of your bag when it is ?- full, or at least 2-3 times a day. Do this for your overnight bag and your leg bag. Wash your hands with soap and water. Separate (detach) the bag from your leg. Hold the bag over the toilet or a clean pail. Keep the bag lower than your hips and bladder. This is so the pee (urine) does not go back into the tube. Open the pour spout. It is at the bottom of the bag. Empty the pee into the toilet or pail. Do not let the pour spout touch any surface. Put rubbing alcohol on a gauze pad or cotton ball. Use the gauze pad or cotton ball to clean the pour spout. Close the pour spout. Attach the bag to your leg with tape or a leg strap. Wash your hands with soap and water. Follow instructions for cleaning the drainage bag. Instructions can come from: The product maker. Your  doctor. How to change the bag Changing the bag Replace your bag when it starts to leak, smell bad, or look dirty. Wash your hands with soap and water. Separate the dirty bag from your leg. Pinch the catheter with your fingers so that pee does not spill out. Separate the catheter tube from the bag tube where these tubes connect (at the connection valve). Do not let the tubes touch any surface. Clean the end of the catheter tube with an alcohol wipe. Use a different alcohol wipe to clean the end of the bag tube. Connect the catheter tube to the tube of the clean bag. Attach the clean bag to your leg with tape or a leg strap. Do not make the bag tight on your leg. Wash your hands with soap and water. General instructions A person washing hands with soap and water.   Never pull on your catheter. Never try to take it out. Doing that can hurt you. Always wash your hands before and after you touch your catheter or bag. Use a mild, fragrance-free soap. If you do not have soap and water, use hand sanitizer. Always make sure there are no twists, bends, or kinks in the catheter tube. Always make sure there are no leaks in the catheter or bag. Drink enough fluid to keep your pee pale yellow. Do not take baths, swim, or use a hot tub. If you are male, wipe from front to back after you poop (have a bowel movement). Contact a doctor if: Your catheter gets clogged. Your catheter leaks. You have signs of infection at the catheter site, such as: Redness, swelling, or pain where the catheter goes into your body. Fluid, blood, pus, or a bad smell coming from the area where the catheter goes into your body. Skin feels warm where the catheter goes into  your body. You have signs of a bladder infection, such as: Fever. Chills. Pee smells worse than usual. Cloudy pee. Pain in your belly, legs, lower back, or bladder. Vomiting or feel like vomiting. Get help right away if: You see blood in the  catheter. Your pee is pink or red. Your bladder feels full. Your pee is not draining into the bag. Your catheter gets pulled out. Summary An indwelling urinary catheter is a thin tube that is placed into the bladder to help drain pee (urine) out of the body. The catheter is placed into the part of the body that drains pee from the bladder (urethra). Taking good care of your catheter will keep it working well. Always wash your hands before and after touching your catheter or bag. Never pull on your catheter or try to take it out. This information is not intended to replace advice given to you by your health care provider. Make sure you discuss any questions you have with your health care provider. Document Revised: 12/29/2020 Document Reviewed: 12/29/2020 Elsevier Patient Education  2023 [employer] Inc.  AMBULATORY SURGERY  DISCHARGE INSTRUCTIONS   The drugs that you were given will stay in your system until tomorrow so for the next 24 hours you should not:  Drive an automobile Make any legal decisions Drink any alcoholic beverage   You may resume regular meals tomorrow.  Today it is better to start with liquids and gradually work up to solid foods.  You may eat anything you prefer, but it is better to start with liquids, then soup and crackers, and gradually work up to solid foods.   Please notify your doctor immediately if you have any unusual bleeding, trouble breathing, redness and pain at the surgery site, drainage, fever, or pain not relieved by medication.    Additional Instructions:        Please contact your physician with any problems or Same Day Surgery at [phone], Monday through Friday 6 am to 4 pm, or [place] [facility] number at [phone].

## 2021-10-19 NOTE — H&P (Signed)
Date of Initial H&P: 10/12/21  History reviewed, patient examined, no change in status, stable for surgery. 

## 2021-10-19 NOTE — Transfer of Care (Signed)
Immediate Anesthesia Transfer of Care Note  Patient: Johnathan Byrd  Procedure(s) Performed: GREEN LIGHT LASER TURP (TRANSURETHRAL RESECTION OF PROSTATE (Prostate)  Patient Location: PACU  Anesthesia Type:General  Level of Consciousness: drowsy  Airway & Oxygen Therapy: Patient Spontanous Breathing and Patient connected to face mask oxygen  Post-op Assessment: Report given to RN and Post -op Vital signs reviewed and stable  Post vital signs: Reviewed and stable  Last Vitals:  Vitals Value Taken Time  BP 125/86   Temp    Pulse 60 10/19/21 1205  Resp 12 10/19/21 1205  SpO2 100 % 10/19/21 1205  Vitals shown include unvalidated device data.  Last Pain:  Vitals:   10/19/21 0625  TempSrc: Temporal  PainSc: 0-No pain         Complications: No notable events documented.

## 2021-10-19 NOTE — Op Note (Signed)
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 Yves Dill MD  Anesthesia: General  Indications: See the history and physical also. he patient is an 88 year old ([place]: [birth year]) white male who developed urinary retention several months ago, but was able to void since being placed on medication.  He has significant voiding symptoms with an IPS score of 25.  He is currently on  finasteride and tamsulosin.  Evaluation in the office included a PSA of 14.6 ng/mL with a subsequent prostate MRI revealing 193 gram prostate with a PI-RADS category 3 lesion on the right side.  The patient has chosen not to proceed with prostate biopsy. He had a Uroflow study in April, indicating a maximum flow rate of 7 mL per second with an average flow rate of 2.9 mL per second based upon a voided volume of 140 mL. Cystoscopy indicated lateral lobe prostatic hypertrophy with a 5 cm prostatic urethral length.  He comes in now for photovaporization of the prostate with the GreenLight laser.After informed consent the above procedure(s) were requested     Technique and findings:   After adequate general anesthesia obtained patient was placed into dorsal lithotomy position and perineum was prepped and draped in usual fashion.  The laser scope was coupled with a camera and visually advanced into the bladder.  The bladder was inspected and moderate trabeculation was noted.  No bladder tumors were identified.  Both ureteral orifices were identified and had clear efflux and normally situated on the trigone.  Inspection of the prostate showed evidence of prior TURP at the bladder neck with some undermining of the bladder neck at the 6 o'clock position.  There was significant obstructing lateral lobe hypertrophy estimated length of 5 cm.  At this point the greenlight XPS laser fiber  was introduced through the scope and set at 105 W of power.  The bladder neck tissue was vaporized.  The power was then increased to 120 W and obstructive tissue from the bladder neck to the verumontanum was vaporized.  Finally the power was increased to 180 W and remaining obstructive tissue vaporized.  The bleeders were controlled with the coagulative setting.  At this point the laser scope was removed and 10 cc of viscous Xylocaine instilled within the urethra and the bladder.  A 20 French silicone catheter was placed and irrigated until clear.  Blood loss was minimal.  The procedure was then terminated and patient transferred to the recovery room in stable condition.

## 2021-10-20 ENCOUNTER — Encounter: Payer: Self-pay | Admitting: Urology

## 2021-10-30 NOTE — Anesthesia Postprocedure Evaluation (Signed)
Anesthesia Post Note  Patient: Johnathan Byrd  Procedure(s) Performed: GREEN LIGHT LASER TURP (TRANSURETHRAL RESECTION OF PROSTATE (Prostate)  Patient location during evaluation: PACU Anesthesia Type: General Level of consciousness: awake and alert Pain management: pain level controlled Vital Signs Assessment: post-procedure vital signs reviewed and stable Respiratory status: spontaneous breathing, nonlabored ventilation, respiratory function stable and patient connected to nasal cannula oxygen Cardiovascular status: blood pressure returned to baseline and stable Postop Assessment: no apparent nausea or vomiting Anesthetic complications: no   No notable events documented.   Last Vitals:  Vitals:   10/19/21 1229 10/19/21 1237  BP: 125/82 (!) 119/96  Pulse: 63 60  Resp: 15 18  Temp: (!) 36.3 C (!) 36.1 C  SpO2: 97% 94%    Last Pain:  Vitals:   10/19/21 1237  TempSrc: Temporal  PainSc: 0-No pain                 Yevette Edwards

## 2021-11-02 DIAGNOSIS — R7303 Prediabetes: Secondary | ICD-10-CM | POA: Insufficient documentation

## 2022-01-09 NOTE — Progress Notes (Unsigned)
Cardiology Office Note  Date:  01/10/2022   ID:  Sagan, Maselli 1930/11/28, MRN [id number]  PCP:  Kandyce Rud, MD   Chief Complaint  Patient presents with   New Patient (Initial Visit)    Ref by Dr. Betsy Coder for A-Fib and acute diastolic heart failure. Patient has moved to [facility] and needs to establish care, was seen by a Cardiologist in [place]. Medications reviewed by the patient verbally.     HPI:  Johnathan Byrd is a 88 year old gentleman with past medical history of Atrial fibrillation dating back to 2013 Chronic diastolic CHF Who presents for new patient evaluation of his permanent atrial fibrillation, diastolic CHF  Prior cardiac history reviewed EKG at [place] in 2013 demonstrated atrial fibrillation  Lost to follow-up from 2018 to 2019.  Hospitalized at [place] in October, 2019, for atrial fibrillation and acute diastolic heart failure, after having pneumonia.  controlled on metoprolol succinate. asymptomatic.   Zio XT 08/22/20-08/24/20: Atrial Fibrillation occurred continuously (100% burden), ranging from 44- 141 bpm (avg of 77 bpm). 1 Pause occurred lasting 3 secs (20 bpm, at 3:39 AM).  No daytime pauses.   Echocardiogram through outside facility  LV/RV systolic function, BAE, no significant valvular dysfunction.  Maintained on Eliquis and metoprolol.  CHA2DS2-VASc score is 2.   (brain MRI May, 2017 demonstrated mild to moderate cortical atrophy with scattered nonspecific white matter changes likely secondary to the sequela of small vessel ischemic disease).   Normal treadmill Cardiolite September, 2017.  Chronic diastolic congestive heart failure  rapid atrial fibrillation and acute diastolic heart failure in May, 2017.  Similar episode in October, 2019.   Echo 2/23 1. Normal left ventricular size and systolic function, ejection fraction 65%.   2. The right ventricle is normal in size, with normal systolic function.   3. Biatrial dilatation.   4. There are no  significant valvular abnormalities.   5. No significant change since prior study from 03/10/2018.   Normal BMP 7/23  EKG personally reviewed by myself on todays visit Atrial fibrillation rate 74 bpm, t wave inversion v4 to v6   PMH:   has a past medical history of Atrial fibrillation (HCC), B12 deficiency, BPH associated with nocturia, BPPV (benign paroxysmal positional vertigo), CHF (congestive heart failure) (HCC), CKD (chronic kidney disease), stage III (HCC), Glaucoma, Incomplete right bundle branch block (RBBB), Long term current use of anticoagulant, and Macular degeneration.  PSH:    Past Surgical History:  Procedure Laterality Date   COLONOSCOPY W/ POLYPECTOMY     GREEN LIGHT LASER TURP (TRANSURETHRAL RESECTION OF PROSTATE N/A 10/19/2021   Procedure: GREEN LIGHT LASER TURP (TRANSURETHRAL RESECTION OF PROSTATE;  Surgeon: Orson Ape, MD;  Location: [facility] ORS;  Service: Urology;  Laterality: N/A;    Current Outpatient Medications  Medication Sig Dispense Refill   acetaminophen (TYLENOL) 500 MG tablet Take 1,000 mg by mouth every 6 (six) hours as needed.     brimonidine (ALPHAGAN) 0.2 % ophthalmic solution 1 drop two (2) times a day.     docusate sodium (COLACE) 100 MG capsule Take 2 capsules (200 mg total) by mouth 2 (two) times daily. 120 capsule 3   finasteride (PROSCAR) 5 MG tablet      furosemide (LASIX) 20 MG tablet Take 1 tablet (20 mg total) by mouth daily as needed for edema. 30 tablet 2   tamsulosin (FLOMAX) 0.4 MG CAPS capsule Take 0.4 mg by mouth daily after breakfast.     apixaban (ELIQUIS) 5 MG  TABS tablet Take 1 tablet (5 mg total) by mouth 2 (two) times daily. 180 tablet 3   cephALEXin (KEFLEX) 500 MG capsule Take 1 capsule (500 mg total) by mouth 4 (four) times daily. (Patient not taking: Reported on 01/10/2022) 12 capsule 0   Cyanocobalamin (VITAMIN B-12) 2500 MCG SUBL Place under the tongue. (Patient not taking: Reported on 10/10/2021)     ketoconazole (NIZORAL)  2 % shampoo Shampoo into the scalp let sit 5-10 minutes then wash out. Use 2-3 days per week. (Patient not taking: Reported on 10/10/2021) 120 mL 6   metoprolol succinate (TOPROL-XL) 25 MG 24 hr tablet Take 1 tablet (25 mg total) by mouth in the morning and at bedtime. 180 tablet 3   terazosin (HYTRIN) 10 MG capsule Take by mouth. (Patient not taking: Reported on 10/10/2021)     No current facility-administered medications for this visit.     Allergies:   Other and Sulfa antibiotics   Social History:  The patient  reports that he has never smoked. He has never used smokeless tobacco. He reports that he does not drink alcohol and does not use drugs.   Family History:   family history includes Alzheimer's disease in his mother; Stroke in his father.    Review of Systems: Review of Systems  Constitutional: Negative.   HENT: Negative.    Respiratory: Negative.    Cardiovascular: Negative.   Gastrointestinal: Negative.   Musculoskeletal: Negative.   Neurological: Negative.   Psychiatric/Behavioral: Negative.    All other systems reviewed and are negative.    PHYSICAL EXAM: VS:  BP 120/70 (BP Location: Right Arm, Patient Position: Sitting, Cuff Size: Normal)   Pulse 74   Ht 5\' 8"  (1.727 m)   Wt 180 lb (81.6 kg)   SpO2 98%   BMI 27.37 kg/m  , BMI Body mass index is 27.37 kg/m. GEN: Well nourished, well developed, in no acute distress HEENT: normal Neck: no JVD, carotid bruits, or masses Cardiac: Irregularly irregular no murmurs, rubs, or gallops,no edema  Respiratory:  clear to auscultation bilaterally, normal work of breathing GI: soft, nontender, nondistended, + BS MS: no deformity or atrophy Skin: warm and dry, no rash Neuro:  Strength and sensation are intact Psych: euthymic mood, full affect   Recent Labs: 10/11/2021: BUN 19; Creatinine, Ser 1.26; Hemoglobin 15.0; Platelets 212; Potassium 4.4; Sodium 137    Lipid Panel No results found for: "CHOL", "HDL", "LDLCALC",  "TRIG"    Wt Readings from Last 3 Encounters:  01/10/22 180 lb (81.6 kg)  10/19/21 175 lb (79.4 kg)  09/08/21 184 lb (83.5 kg)      ASSESSMENT AND PLAN:  Problem List Items Addressed This Visit   None Visit Diagnoses     Chronic diastolic CHF (congestive heart failure) (HCC)    -  Primary   Relevant Medications   apixaban (ELIQUIS) 5 MG TABS tablet   metoprolol succinate (TOPROL-XL) 25 MG 24 hr tablet   furosemide (LASIX) 20 MG tablet   Other Relevant Orders   EKG 12-Lead   Persistent atrial fibrillation (HCC)       Relevant Medications   apixaban (ELIQUIS) 5 MG TABS tablet   metoprolol succinate (TOPROL-XL) 25 MG 24 hr tablet   furosemide (LASIX) 20 MG tablet   Other Relevant Orders   EKG 12-Lead   Benign essential HTN       Relevant Medications   apixaban (ELIQUIS) 5 MG TABS tablet   metoprolol succinate (TOPROL-XL) 25 MG 24 hr  tablet   furosemide (LASIX) 20 MG tablet      Atrial fibrillation, permanent Rate well controlled on metoprolol succinate 25 twice daily Tolerating Eliquis 5 twice daily Does not qualify for reduced dosing No further testing needed at this time  Chronic diastolic CHF Prior hospitalizations, none recently Excessive salt intake Denies shortness of breath Recommend he have prescription for Lasix 20 mg to take as needed for any worsening shortness of breath, ankle swelling abdominal bloating, PND orthopnea  HTN Blood pressure is well controlled on today's visit. No changes made to the medications.  Extensive review of records  Total encounter time more than 60 minutes  Greater than 50% was spent in counseling and coordination of care with the patient    Signed, Dossie Arbour, M.D., Ph.D. [facility] [place], [place] [phone]

## 2022-01-10 ENCOUNTER — Ambulatory Visit: Payer: Medicare Other | Attending: Cardiovascular Disease | Admitting: Cardiovascular Disease

## 2022-01-10 ENCOUNTER — Encounter: Payer: Self-pay | Admitting: Cardiovascular Disease

## 2022-01-10 VITALS — BP 120/70 | HR 74 | Ht 68.0 in | Wt 180.0 lb

## 2022-01-10 DIAGNOSIS — I4819 Other persistent atrial fibrillation: Secondary | ICD-10-CM

## 2022-01-10 DIAGNOSIS — I5032 Chronic diastolic (congestive) heart failure: Secondary | ICD-10-CM

## 2022-01-10 DIAGNOSIS — I10 Essential (primary) hypertension: Secondary | ICD-10-CM | POA: Diagnosis not present

## 2022-01-10 MED ORDER — APIXABAN 5 MG PO TABS: 5.0000 mg | ORAL_TABLET | Freq: Two times a day (BID) | ORAL | 3 refills | Status: DC

## 2022-01-10 MED ORDER — FUROSEMIDE 20 MG PO TABS: 20.0000 mg | ORAL_TABLET | Freq: Every day | ORAL | 2 refills | Status: DC | PRN

## 2022-01-10 MED ORDER — METOPROLOL SUCCINATE ER 25 MG PO TB24: 25.0000 mg | ORAL_TABLET | Freq: Two times a day (BID) | ORAL | 3 refills | Status: DC

## 2022-01-10 NOTE — Patient Instructions (Signed)
Medication Instructions:  Lasix/furosemide 20 mg daily as needed for shortness of breath, leg swelling.ankle swelling, ABD tightness/bloating Take sparingly   If you need a refill on your cardiac medications before your next appointment, please call your pharmacy.   Lab work: No new labs needed  Testing/Procedures: No new testing needed  Follow-Up: At [facility], you and your health needs are our priority.  As part of our continuing mission to provide you with exceptional heart care, we have created designated Provider Care Teams.  These Care Teams include your primary Cardiologist (physician) and Advanced Practice Providers (APPs -  Physician Assistants and Nurse Practitioners) who all work together to provide you with the care you need, when you need it.  You will need a follow up appointment in 6 months, APP ok  Providers on your designated Care Team:   Nicolasa Ducking, NP Eula Listen, PA-C Cadence Fransico Michael, [place]  COVID-19 Vaccine Information can be found at: [email or web address].[email or web address] For questions related to vaccine distribution or appointments, please email [email or web address] or call [phone].

## 2022-04-12 ENCOUNTER — Other Ambulatory Visit: Payer: Self-pay | Admitting: Dermatology

## 2022-04-12 DIAGNOSIS — L219 Seborrheic dermatitis, unspecified: Secondary | ICD-10-CM

## 2022-07-14 NOTE — Progress Notes (Unsigned)
Cardiology Office Note  Date:  07/16/2022   ID:  [name], DOB 13-Apr-1931, MRN [id number]  PCP:  Johnathan Byrd, Johnathan Byrd   Chief Complaint  Patient presents with   6 month follow up     "Doing well." Medications reviewed by the patient verbally.     HPI:  Johnathan Byrd is a 89 year old gentleman with past medical history of Atrial fibrillation dating back to 0000000 Chronic diastolic CHF Who presents for for follow-up of his permanent atrial fibrillation, diastolic CHF  Last seen by myself in clinic August 2023  Lives at [facility] In follow-up today reports he is doing well Goes to gym, started using the bicycle this past week Reports his balance is good, no falls  Wonders if he needs to take Eliquis regularly or can he reduce the dose Reports he is asymptomatic from his atrial fibrillation  Denies chest pain, sob, no leg edema, no orthopnea PND Not taking Lasix Continues on metoprolol succinate 50 in the evening  Labs reviewed A1c 6.0 TOTAL CHOL 169, ldl 100 Cr 1.2  EKG personally reviewed by myself on todays visit Atrial fibrillation with ventricular rate 59 bpm T wave abnormality anterolateral leads I and aVL, precordial leads  Prior cardiac history reviewed EKG at [place] in 2013 demonstrated atrial fibrillation  Lost to follow-up from 2018 to 2019.  Hospitalized at [place] in October, 2019, for atrial fibrillation and acute diastolic heart failure, after having pneumonia.  controlled on metoprolol succinate. asymptomatic.   Zio XT 08/22/20-08/24/20: Atrial Fibrillation occurred continuously (100% burden), ranging from 44- 141 bpm (avg of 77 bpm). 1 Pause occurred lasting 3 secs (20 bpm, at 3:39 AM).  No daytime pauses.   Echocardiogram through outside facility  LV/RV systolic function, BAE, no significant valvular dysfunction.  Maintained on Eliquis and metoprolol.  CHA2DS2-VASc score is 2.   (brain MRI May, 2017 demonstrated mild to moderate cortical atrophy with  scattered nonspecific white matter changes likely secondary to the sequela of small vessel ischemic disease).   Normal treadmill Cardiolite September, 2017.  Chronic diastolic congestive heart failure  rapid atrial fibrillation and acute diastolic heart failure in May, 2017.  Similar episode in October, 2019.   Echo 2/23 1. Normal left ventricular size and systolic function, ejection fraction 65%.   2. The right ventricle is normal in size, with normal systolic function.   3. Biatrial dilatation.   4. There are no significant valvular abnormalities.   5. No significant change since prior study from 03/10/2018.    PMH:   has a past medical history of Atrial fibrillation (Johnathan Byrd), B12 deficiency, BPH associated with nocturia, BPPV (benign paroxysmal positional vertigo), CHF (congestive heart failure) ([place]), CKD (chronic kidney disease), stage III ([place]), Glaucoma, Incomplete right bundle branch block (RBBB), Long term current use of anticoagulant, and Macular degeneration.  PSH:    Past Surgical History:  Procedure Laterality Date   COLONOSCOPY W/ POLYPECTOMY     GREEN LIGHT LASER TURP (TRANSURETHRAL RESECTION OF PROSTATE N/A 10/19/2021   Procedure: GREEN LIGHT LASER TURP (TRANSURETHRAL RESECTION OF PROSTATE;  Surgeon: Johnathan Byrd, Johnathan Byrd;  Location: [facility] ORS;  Service: Urology;  Laterality: N/A;    Current Outpatient Medications  Medication Sig Dispense Refill   apixaban (ELIQUIS) 5 MG TABS tablet Take 1 tablet (5 mg total) by mouth 2 (two) times daily. 180 tablet 3   ascorbic acid (VITAMIN C) 500 MG tablet Take 500 mg by mouth daily.     brimonidine (ALPHAGAN) 0.2 %  ophthalmic solution 1 drop two (2) times a day.     Cyanocobalamin (VITAMIN B-12) 2500 MCG SUBL Place under the tongue.     docusate sodium (COLACE) 100 MG capsule Take 2 capsules (200 mg total) by mouth 2 (two) times daily. 120 capsule 3   finasteride (PROSCAR) 5 MG tablet 5 mg daily.     furosemide (LASIX) 20 MG tablet Take  1 tablet (20 mg total) by mouth daily as needed for edema. 30 tablet 2   ketoconazole (NIZORAL) 2 % shampoo Shampoo into the scalp let sit 5-10 minutes then wash out. Use 2-3 days per week. 120 mL 6   metoprolol succinate (TOPROL-XL) 25 MG 24 hr tablet Take 1 tablet (25 mg total) by mouth in the morning and at bedtime. 180 tablet 3   tamsulosin (FLOMAX) 0.4 MG CAPS capsule Take 0.4 mg by mouth daily after breakfast.     Zinc Picolinate POWD Take by mouth.     acetaminophen (TYLENOL) 500 MG tablet Take 1,000 mg by mouth every 6 (six) hours as needed. (Patient not taking: Reported on 07/16/2022)     cephALEXin (KEFLEX) 500 MG capsule Take 1 capsule (500 mg total) by mouth 4 (four) times daily. (Patient not taking: Reported on 01/10/2022) 12 capsule 0   terazosin (HYTRIN) 10 MG capsule Take by mouth. (Patient not taking: Reported on 07/16/2022)     No current facility-administered medications for this visit.     Allergies:   Other and Sulfa antibiotics   Social History:  The patient  reports that he has never smoked. He has never used smokeless tobacco. He reports that he does not drink alcohol and does not use drugs.   Family History:   family history includes Alzheimer's disease in his mother; Stroke in his father.    Review of Systems: Review of Systems  Constitutional: Negative.   HENT: Negative.    Respiratory: Negative.    Cardiovascular: Negative.   Gastrointestinal: Negative.   Musculoskeletal: Negative.   Neurological: Negative.   Psychiatric/Behavioral: Negative.    All other systems reviewed and are negative.   PHYSICAL EXAM: VS:  BP 122/80 (BP Location: Left Arm, Patient Position: Sitting, Cuff Size: Normal)   Pulse (!) 59   Ht '5\' 8"'$  (1.727 m)   Wt 182 lb (82.6 kg)   SpO2 98%   BMI 27.67 kg/m  , BMI Body mass index is 27.67 kg/m. Constitutional:  oriented to person, place, and time. No distress.  HENT:  Head: Grossly normal Eyes:  no discharge. No scleral icterus.   Neck: No JVD, no carotid bruits  Cardiovascular: Irregularly irregular, no murmurs appreciated Pulmonary/Chest: Clear to auscultation bilaterally, no wheezes or rails Abdominal: Soft.  no distension.  no tenderness.  Musculoskeletal: Normal range of motion Neurological:  normal muscle tone. Coordination normal. No atrophy Skin: Skin warm and dry Psychiatric: normal affect, pleasant  Recent Labs: 10/11/2021: BUN 19; Creatinine, Ser 1.26; Hemoglobin 15.0; Platelets 212; Potassium 4.4; Sodium 137    Lipid Panel No results found for: "CHOL", "HDL", "LDLCALC", "TRIG"    Wt Readings from Last 3 Encounters:  07/16/22 182 lb (82.6 kg)  01/10/22 180 lb (81.6 kg)  10/19/21 175 lb (79.4 kg)     ASSESSMENT AND PLAN:  Problem List Items Addressed This Visit   None Visit Diagnoses     Persistent atrial fibrillation (HCC)    -  Primary   Chronic diastolic CHF (congestive heart failure) (HCC)       Benign  essential HTN         Atrial fibrillation, permanent Rate well-controlled, recommend he continue Eliquis 5 twice daily Does not qualify for reduced dosing Will change metoprolol succinate to 50 mg in the evening.  Previously was taking 2 of the 25 mg pills in the evening  Chronic diastolic CHF Prior hospitalizations, none recently Excessive salt intake Denies shortness of breath, leg swelling, weight gain, PND orthopnea Recommend he take Lasix 20 mg to take as needed for any worsening shortness of breath, ankle swelling abdominal bloating, PND orthopnea  HTN Blood pressure is well controlled on today's visit. No changes made to the medications.    Total encounter time more than 30 minutes  Greater than 50% was spent in counseling and coordination of care with the patient    Signed, Johnathan Byrd, M.D., Ph.D. [place], [place]

## 2022-07-16 ENCOUNTER — Ambulatory Visit: Payer: Medicare Other | Attending: Cardiovascular Disease | Admitting: Cardiovascular Disease

## 2022-07-16 ENCOUNTER — Encounter: Payer: Self-pay | Admitting: Cardiovascular Disease

## 2022-07-16 VITALS — BP 122/80 | HR 59 | Ht 68.0 in | Wt 182.0 lb

## 2022-07-16 DIAGNOSIS — I5032 Chronic diastolic (congestive) heart failure: Secondary | ICD-10-CM

## 2022-07-16 DIAGNOSIS — I10 Essential (primary) hypertension: Secondary | ICD-10-CM

## 2022-07-16 DIAGNOSIS — I4819 Other persistent atrial fibrillation: Secondary | ICD-10-CM

## 2022-07-16 MED ORDER — METOPROLOL SUCCINATE ER 50 MG PO TB24: 50.0000 mg | ORAL_TABLET | Freq: Every evening | ORAL | 3 refills | Status: DC

## 2022-07-16 NOTE — Patient Instructions (Addendum)
Medication Instructions:  INCREASE metoprolol succinate to 50 mg one pill at night  If you need a refill on your cardiac medications before your next appointment, please call your pharmacy.   Lab work: No new labs needed  Testing/Procedures: No new testing needed  Follow-Up: At [facility], you and your health needs are our priority.  As part of our continuing mission to provide you with exceptional heart care, we have created designated Provider Care Teams.  These Care Teams include your primary Cardiologist (physician) and Advanced Practice Providers (APPs -  Physician Assistants and Nurse Practitioners) who all work together to provide you with the care you need, when you need it.  You will need a follow up appointment in 12 months  Providers on your designated Care Team:   Murray Hodgkins, NP Christell Faith, PA-C Cadence Kathlen Mody, [place]  COVID-19 Vaccine Information can be found at: [email or web address].uk For questions related to vaccine distribution or appointments, please email [email or web address] or call [phone].

## 2022-07-26 ENCOUNTER — Encounter: Payer: Self-pay | Admitting: Urology

## 2022-07-26 ENCOUNTER — Ambulatory Visit: Payer: Medicare Other | Admitting: Urology

## 2022-07-26 VITALS — BP 120/70 | HR 68 | Ht 68.0 in | Wt 180.0 lb

## 2022-07-26 DIAGNOSIS — N401 Enlarged prostate with lower urinary tract symptoms: Secondary | ICD-10-CM

## 2022-07-26 DIAGNOSIS — R350 Frequency of micturition: Secondary | ICD-10-CM | POA: Diagnosis not present

## 2022-07-26 DIAGNOSIS — R972 Elevated prostate specific antigen [PSA]: Secondary | ICD-10-CM

## 2022-07-26 NOTE — Progress Notes (Signed)
07/26/2022 1:40 PM   Johnathan Byrd 1930/06/28 [id number]  Referring provider: Derinda Late, MD [phone] [facility] [place],  Johnathan Byrd [id number]  Chief Complaint  Patient presents with   Other    HPI: Johnathan Byrd is a 89 y.o. male who presents to transfer urologic care after the recent retirement of his previous urologist.  Previously followed by Dr. Yves Dill for BPH with lower urinary tract symptoms. Was having severe lower urinary tract symptoms on combination therapy with tamsulosin and finasteride and underwent PVP by Dr. Yves Dill 10/19/2021 A prostate MRI performed prior to surgery showed 193 g prostate.  He had a PI-RADS 3 lesion and elected not to pursue biopsy. He has had previous benign prostate biopsies Last visit with Dr. Yves Dill was October 2023.  He had been on Gemtesa for storage related voiding symptoms however is no longer taking this medication.  He remains on tamsulosin and finasteride A PSA 03/06/2022 was 4.8 Since his PVP he states he is voiding with a better stream.  He does have nocturia x 3-4 and occasional episodes of urge incontinence.  He is currently satisfied with his voiding pattern   PMH: Past Medical History:  Diagnosis Date   Atrial fibrillation ([place])    a.) CHA2DS2-VASc = 4 (age x 2, CHF, HTN). b.) rate/rhythm maintained on oral metoprolol succinate; chronically anticoagulated with apixaban.   B12 deficiency    BPH associated with nocturia    BPPV (benign paroxysmal positional vertigo)    CHF (congestive heart failure) (HCC)    CKD (chronic kidney disease), stage III (HCC)    Glaucoma    Incomplete right bundle branch block (RBBB)    Long term current use of anticoagulant    a.) apixaban   Macular degeneration     Surgical History: Past Surgical History:  Procedure Laterality Date   COLONOSCOPY W/ POLYPECTOMY     GREEN LIGHT LASER TURP (TRANSURETHRAL RESECTION OF PROSTATE N/A 10/19/2021    Procedure: GREEN LIGHT LASER TURP (TRANSURETHRAL RESECTION OF PROSTATE;  Surgeon: Royston Cowper, MD;  Location: [facility] ORS;  Service: Urology;  Laterality: N/A;    Home Medications:  Allergies as of 07/26/2022       Reactions   Other Other (See Comments)   Unknown Unknown   Sulfa Antibiotics         Medication List        Accurate as of July 26, 2022  1:40 PM. If you have any questions, ask your nurse or doctor.          STOP taking these medications    cephALEXin 500 MG capsule Commonly known as: Keflex Stopped by: Abbie Sons, MD       TAKE these medications    acetaminophen 500 MG tablet Commonly known as: TYLENOL Take 1,000 mg by mouth every 6 (six) hours as needed.   apixaban 5 MG Tabs tablet Commonly known as: ELIQUIS Take 1 tablet (5 mg total) by mouth 2 (two) times daily.   ascorbic acid 500 MG tablet Commonly known as: VITAMIN C Take 500 mg by mouth daily.   brimonidine 0.2 % ophthalmic solution Commonly known as: ALPHAGAN 1 drop two (2) times a day.   docusate sodium 100 MG capsule Commonly known as: COLACE Take 2 capsules (200 mg total) by mouth 2 (two) times daily.   finasteride 5 MG tablet Commonly known as: PROSCAR 5 mg daily.   furosemide 20 MG tablet  Commonly known as: Lasix Take 1 tablet (20 mg total) by mouth daily as needed for edema.   ketoconazole 2 % shampoo Commonly known as: NIZORAL Shampoo into the scalp let sit 5-10 minutes then wash out. Use 2-3 days per week.   metoprolol succinate 50 MG 24 hr tablet Commonly known as: TOPROL-XL Take 1 tablet (50 mg total) by mouth every evening.   tamsulosin 0.4 MG Caps capsule Commonly known as: FLOMAX Take 0.4 mg by mouth daily after breakfast.   terazosin 10 MG capsule Commonly known as: HYTRIN Take by mouth.   Vitamin B-12 2500 MCG Subl Place under the tongue.   Zinc Picolinate Powd Take by mouth.        Allergies:  Allergies  Allergen Reactions   Other  Other (See Comments)    Unknown  Unknown   Sulfa Antibiotics     Family History: Family History  Problem Relation Age of Onset   Alzheimer's disease Mother    Stroke Father     Social History:  reports that he has never smoked. He has never used smokeless tobacco. He reports that he does not drink alcohol and does not use drugs.   Physical Exam: BP 120/70   Pulse 68   Ht '5\' 8"'$  (1.727 m)   Wt 180 lb (81.6 kg)   BMI 27.37 kg/m   Constitutional:  Alert and oriented, No acute distress. HEENT: [place] AT Respiratory: Normal respiratory effort, no increased work of breathing. Psychiatric: Normal mood and affect.   Assessment & Plan:    1.  BPH with LUTS Continue tamsulosin/finasteride Follow-up annually or as needed for any change in his voiding symptoms  2.  Elevated PSA Last PSA stable 4.8 Will discuss on follow-up if he wants to pursue PSA testing based on his age   Abbie Sons, MD  [facility] [address] [place], [place] [address] [phone]

## 2022-10-16 ENCOUNTER — Other Ambulatory Visit: Payer: Self-pay | Admitting: *Deleted

## 2022-10-16 MED ORDER — FINASTERIDE 5 MG PO TABS: 5.0000 mg | ORAL_TABLET | Freq: Every day | ORAL | 11 refills | Status: DC

## 2023-01-07 ENCOUNTER — Other Ambulatory Visit: Payer: Self-pay | Admitting: Cardiovascular Disease

## 2023-01-07 DIAGNOSIS — I4891 Unspecified atrial fibrillation: Secondary | ICD-10-CM

## 2023-01-07 NOTE — Telephone Encounter (Signed)
Prescription refill request for Eliquis received. Indication: Afib  Last office visit: 07/16/22 Mariah Milling)  Scr: 1.1 (12/13/22)  Age: 89 Weight: 81.6kg  Appropriate dose. Refill sent.

## 2023-01-07 NOTE — Telephone Encounter (Signed)
Refill request

## 2023-01-13 ENCOUNTER — Ambulatory Visit
Admission: EM | Admit: 2023-01-13 | Discharge: 2023-01-13 | Disposition: A | Discharge status: Home / Self Care | Payer: Medicare Other | Attending: Physician Assistant | Admitting: Physician Assistant

## 2023-01-13 DIAGNOSIS — R051 Acute cough: Secondary | ICD-10-CM | POA: Diagnosis not present

## 2023-01-13 DIAGNOSIS — U071 COVID-19: Secondary | ICD-10-CM | POA: Diagnosis not present

## 2023-01-13 MED ORDER — BENZONATATE 100 MG PO CAPS
100.0000 mg | ORAL_CAPSULE | Freq: Three times a day (TID) | ORAL | 0 refills | Status: DC
Start: 2023-01-13 — End: 2023-07-26

## 2023-01-13 MED ORDER — ALBUTEROL SULFATE HFA 108 (90 BASE) MCG/ACT IN AERS: 1.0000 | INHALATION_SPRAY | Freq: Four times a day (QID) | RESPIRATORY_TRACT | 0 refills | Status: DC | PRN

## 2023-01-13 NOTE — Discharge Instructions (Addendum)
Use inhaler as needed Can take tessalon as needed for cough Drink plenty of fluids, rest Can take Mucinex and use Flonase  Return if you develop new or worsening symptoms

## 2023-01-13 NOTE — ED Provider Notes (Signed)
Johnathan Byrd    CSN: [id number] Arrival date & time: 01/13/23  1310      History   Chief Complaint Chief Complaint  Patient presents with   Cough   Covid Positive    HPI Johnathan Byrd is a 89 y.o. male.   Patient presents with concern for persistent cough.  He reports he tested positive for COVID about 5 days ago.  He denies body aches, headaches, shortness of breath, wheezing.  He reports he is having coughing fits which are not improved with over-the-counter cough syrup.  He reports during coughing fits he feels some mild wheezing.  Has no history of asthma or lung disease.    Past Medical History:  Diagnosis Date   Atrial fibrillation (HCC)    a.) CHA2DS2-VASc = 4 (age x 2, CHF, HTN). b.) rate/rhythm maintained on oral metoprolol succinate; chronically anticoagulated with apixaban.   B12 deficiency    BPH associated with nocturia    BPPV (benign paroxysmal positional vertigo)    CHF (congestive heart failure) (HCC)    CKD (chronic kidney disease), stage III (HCC)    Glaucoma    Incomplete right bundle branch block (RBBB)    Long term current use of anticoagulant    a.) apixaban   Macular degeneration     There are no problems to display for this patient.   Past Surgical History:  Procedure Laterality Date   COLONOSCOPY W/ POLYPECTOMY     GREEN LIGHT LASER TURP (TRANSURETHRAL RESECTION OF PROSTATE N/A 10/19/2021   Procedure: GREEN LIGHT LASER TURP (TRANSURETHRAL RESECTION OF PROSTATE;  Surgeon: Orson Ape, MD;  Location: [facility] ORS;  Service: Urology;  Laterality: N/A;       Home Medications    Prior to Admission medications   Medication Sig Start Date End Date Taking? Authorizing Provider  albuterol (VENTOLIN HFA) 108 (90 Base) MCG/ACT inhaler Inhale 1-2 puffs into the lungs every 6 (six) hours as needed for wheezing or shortness of breath. 01/13/23  Yes Ward, Tylene Fantasia, PA-C  benzonatate (TESSALON) 100 MG capsule Take 1 capsule (100 mg total) by  mouth every 8 (eight) hours. 01/13/23  Yes Ward, Tylene Fantasia, PA-C  acetaminophen (TYLENOL) 500 MG tablet Take 1,000 mg by mouth every 6 (six) hours as needed.    [provider]  apixaban (ELIQUIS) 5 MG TABS tablet TAKE 1 TABLET(5 MG) BY MOUTH TWICE DAILY 01/07/23   Antonieta Iba, MD  ascorbic acid (VITAMIN C) 500 MG tablet Take 500 mg by mouth daily.    [provider]  brimonidine (ALPHAGAN) 0.2 % ophthalmic solution 1 drop two (2) times a day.    [provider]  Cyanocobalamin (VITAMIN B-12) 2500 MCG SUBL Place under the tongue.    [provider]  docusate sodium (COLACE) 100 MG capsule Take 2 capsules (200 mg total) by mouth 2 (two) times daily. 10/19/21   Orson Ape, MD  finasteride (PROSCAR) 5 MG tablet Take 1 tablet (5 mg total) by mouth daily. 10/16/22   Stoioff, Verna Czech, MD  furosemide (LASIX) 20 MG tablet Take 1 tablet (20 mg total) by mouth daily as needed for edema. 01/10/22   Antonieta Iba, MD  ketoconazole (NIZORAL) 2 % shampoo Shampoo into the scalp let sit 5-10 minutes then wash out. Use 2-3 days per week. 03/27/21   Deirdre Evener, MD  metoprolol succinate (TOPROL-XL) 50 MG 24 hr tablet Take 1 tablet (50 mg total) by mouth every evening.  07/16/22   Antonieta Iba, MD  tamsulosin (FLOMAX) 0.4 MG CAPS capsule Take 0.4 mg by mouth daily after breakfast.    [provider]  terazosin (HYTRIN) 10 MG capsule Take by mouth.    [provider]  Zinc Picolinate POWD Take by mouth.    [provider]    Family History Family History  Problem Relation Age of Onset   Alzheimer's disease Mother    Stroke Father     Social History Social History   Tobacco Use   Smoking status: Never   Smokeless tobacco: Never  Vaping Use   Vaping status: Never Used  Substance Use Topics   Alcohol use: Never   Drug use: Never     Allergies   Other and Sulfa antibiotics   Review of Systems Review of Systems   Constitutional:  Negative for chills and fever.  HENT:  Negative for ear pain and sore throat.   Eyes:  Negative for pain and visual disturbance.  Respiratory:  Positive for cough. Negative for shortness of breath.   Cardiovascular:  Negative for chest pain and palpitations.  Gastrointestinal:  Negative for abdominal pain and vomiting.  Genitourinary:  Negative for dysuria and hematuria.  Musculoskeletal:  Negative for arthralgias and back pain.  Skin:  Negative for color change and rash.  Neurological:  Negative for seizures and syncope.  All other systems reviewed and are negative.    Physical Exam Triage Vital Signs ED Triage Vitals  Encounter Vitals Group     BP 01/13/23 1400 (!) 143/87     Systolic BP Percentile --      Diastolic BP Percentile --      Pulse Rate 01/13/23 1400 83     Resp 01/13/23 1400 17     Temp 01/13/23 1400 99.1 F (37.3 C)     Temp Source 01/13/23 1400 Oral     SpO2 01/13/23 1400 97 %     Weight --      Height --      Head Circumference --      Peak Flow --      Pain Score 01/13/23 1401 0     Pain Loc --      Pain Education --      Exclude from Growth Chart --    No data found.  Updated Vital Signs BP (!) 143/87 (BP Location: Left Arm)   Pulse 83   Temp 99.1 F (37.3 C) (Oral)   Resp 17   SpO2 97%   Visual Acuity Right Eye Distance:   Left Eye Distance:   Bilateral Distance:    Right Eye Near:   Left Eye Near:    Bilateral Near:     Physical Exam Vitals and nursing note reviewed.  Constitutional:      General: He is not in acute distress.    Appearance: He is well-developed.  HENT:     Head: Normocephalic and atraumatic.  Eyes:     Conjunctiva/sclera: Conjunctivae normal.  Cardiovascular:     Rate and Rhythm: Normal rate and regular rhythm.     Heart sounds: No murmur heard. Pulmonary:     Effort: Pulmonary effort is normal. No respiratory distress.     Breath sounds: Normal breath sounds.  Abdominal:     Palpations:  Abdomen is soft.     Tenderness: There is no abdominal tenderness.  Musculoskeletal:        General: No swelling.     Cervical back:  Neck supple.  Skin:    General: Skin is warm and dry.     Capillary Refill: Capillary refill takes less than 2 seconds.  Neurological:     Mental Status: He is alert.  Psychiatric:        Mood and Affect: Mood normal.      UC Treatments / Results  Labs (all labs ordered are listed, but only abnormal results are displayed) Labs Reviewed - No data to display  EKG   Radiology No results found.  Procedures Procedures (including critical care time)  Medications Ordered in UC Medications - No data to display  Initial Impression / Assessment and Plan / UC Course  I have reviewed the triage vital signs and the nursing notes.  Pertinent labs & imaging results that were available during my care of the patient were reviewed by me and considered in my medical decision making (see chart for details).     Cough associated with COVID diagnosis.  Patient no acute distress, vitals within normal limits, lungs clear to auscultation.  Will send in Tessalon and albuterol inhaler to use as needed.  Return precautions discussed. Final Clinical Impressions(s) / UC Diagnoses   Final diagnoses:  Acute cough  COVID     Discharge Instructions      Use inhaler as needed Can take tessalon as needed for cough Drink plenty of fluids, rest Can take Mucinex and use Flonase  Return if you develop new or worsening symptoms   ED Prescriptions     Medication Sig Dispense Auth. Provider   benzonatate (TESSALON) 100 MG capsule Take 1 capsule (100 mg total) by mouth every 8 (eight) hours. 21 capsule Ward, Shanda Bumps Z, PA-C   albuterol (VENTOLIN HFA) 108 (90 Base) MCG/ACT inhaler Inhale 1-2 puffs into the lungs every 6 (six) hours as needed for wheezing or shortness of breath. 1 each Ward, Tylene Fantasia, PA-C      PDMP not reviewed this encounter.   Ward, Tylene Fantasia, PA-C 01/13/23 1415

## 2023-01-13 NOTE — ED Triage Notes (Signed)
Pt reports a cough and discomfort in the throat when coughing. Was recently dx with Covid on Wednesday.

## 2023-01-30 ENCOUNTER — Other Ambulatory Visit: Payer: Self-pay

## 2023-01-30 ENCOUNTER — Emergency Department: Payer: Medicare Other

## 2023-01-30 DIAGNOSIS — N183 Chronic kidney disease, stage 3 unspecified: Secondary | ICD-10-CM | POA: Insufficient documentation

## 2023-01-30 DIAGNOSIS — U071 COVID-19: Secondary | ICD-10-CM | POA: Diagnosis not present

## 2023-01-30 DIAGNOSIS — K5900 Constipation, unspecified: Secondary | ICD-10-CM | POA: Insufficient documentation

## 2023-01-30 DIAGNOSIS — Z7901 Long term (current) use of anticoagulants: Secondary | ICD-10-CM | POA: Insufficient documentation

## 2023-01-30 DIAGNOSIS — R109 Unspecified abdominal pain: Secondary | ICD-10-CM | POA: Diagnosis present

## 2023-01-30 DIAGNOSIS — D72829 Elevated white blood cell count, unspecified: Secondary | ICD-10-CM | POA: Diagnosis not present

## 2023-01-30 DIAGNOSIS — I509 Heart failure, unspecified: Secondary | ICD-10-CM | POA: Insufficient documentation

## 2023-01-30 LAB — COMPREHENSIVE METABOLIC PANEL WITH GFR
ALT: 18 U/L (ref 0–44)
AST: 22 U/L (ref 15–41)
Albumin: 4 g/dL (ref 3.5–5.0)
Alkaline Phosphatase: 76 U/L (ref 38–126)
Anion gap: 15 (ref 5–15)
BUN: 22 mg/dL (ref 8–23)
CO2: 23 mmol/L (ref 22–32)
Calcium: 9.7 mg/dL (ref 8.9–10.3)
Chloride: 100 mmol/L (ref 98–111)
Creatinine, Ser: 1.24 mg/dL (ref 0.61–1.24)
GFR, Estimated: 55 mL/min — ABNORMAL LOW (ref >60)
Glucose, Bld: 176 mg/dL — ABNORMAL HIGH (ref 70–99)
Potassium: 4.1 mmol/L (ref 3.5–5.1)
Sodium: 138 mmol/L (ref 135–145)
Total Bilirubin: 1.3 mg/dL — ABNORMAL HIGH (ref 0.3–1.2)
Total Protein: 6.7 g/dL (ref 6.5–8.1)

## 2023-01-30 LAB — CBC
HCT: 50.3 % (ref 39.0–52.0)
Hemoglobin: 16.6 g/dL (ref 13.0–17.0)
MCH: 31.4 pg (ref 26.0–34.0)
MCHC: 33 g/dL (ref 30.0–36.0)
MCV: 95.1 fL (ref 80.0–100.0)
Platelets: 263 10*3/uL (ref 150–400)
RBC: 5.29 MIL/uL (ref 4.22–5.81)
RDW: 13.2 % (ref 11.5–15.5)
WBC: 15 10*3/uL — ABNORMAL HIGH (ref 4.0–10.5)
nRBC: 0 % (ref 0.0–0.2)

## 2023-01-30 LAB — RESP PANEL BY RT-PCR (RSV, FLU A&B, COVID)  RVPGX2
Influenza A by PCR: NEGATIVE
Influenza B by PCR: NEGATIVE
Resp Syncytial Virus by PCR: NEGATIVE
SARS Coronavirus 2 by RT PCR: POSITIVE — AB

## 2023-01-30 LAB — LIPASE, BLOOD: Lipase: 31 U/L (ref 11–51)

## 2023-01-30 MED ORDER — IOHEXOL 300 MG/ML  SOLN
100.0000 mL | Freq: Once | INTRAMUSCULAR | Status: AC | PRN
  Administered 2023-01-30: 100 mL via INTRAVENOUS

## 2023-01-30 MED ORDER — ONDANSETRON HCL 4 MG/2ML IJ SOLN
4.0000 mg | Freq: Once | INTRAMUSCULAR | Status: AC
  Administered 2023-01-30: 4 mg via INTRAVENOUS
  Filled 2023-01-30: qty 2

## 2023-01-30 NOTE — ED Triage Notes (Signed)
EMS brings pt in from [facility] for c/o constipation x 3 days; laxative without relief; c/o abd/hemorrhoid pain

## 2023-01-30 NOTE — ED Triage Notes (Signed)
BIB [facility] from [facility]. Reports constipation x 3 days. No relief with miralax and lactulose. Reports abd pain and rectal pain. Pt alert and oriented. Breathing unlabored speaking in full sentences. Symmetric chest rise and fall.

## 2023-01-30 NOTE — ED Notes (Addendum)
Pt repeatedly vomiting in triage. Reports COVID 3 wks ago.

## 2023-01-31 ENCOUNTER — Emergency Department
Admission: EM | Admit: 2023-01-31 | Discharge: 2023-01-31 | Disposition: A | Discharge status: Home / Self Care | Payer: Medicare Other | Attending: Emergency Medicine | Admitting: Emergency Medicine

## 2023-01-31 DIAGNOSIS — K5900 Constipation, unspecified: Secondary | ICD-10-CM

## 2023-01-31 MED ORDER — ONDANSETRON 4 MG PO TBDP: 4.0000 mg | ORAL_TABLET | Freq: Four times a day (QID) | ORAL | 0 refills | Status: DC | PRN

## 2023-01-31 MED ORDER — ONDANSETRON HCL 4 MG/2ML IJ SOLN
4.0000 mg | Freq: Once | INTRAMUSCULAR | Status: AC
  Administered 2023-01-31: 4 mg via INTRAVENOUS
  Filled 2023-01-31: qty 2

## 2023-01-31 NOTE — ED Provider Notes (Signed)
[facility] Provider Note    Event Date/Time   First MD Initiated Contact with Patient 01/31/23 [phone]     (approximate)   History   Abdominal Pain   HPI  Johnathan Byrd is a 89 y.o. male with history of A-fib, CHF, CKD who presents to the emergency department with constipation.  States he had not been able to have a normal bowel movement in several days but is passing gas.  Did not have any vomiting until he was in the ED.  Was able to have a bowel movement in triage and reports it was liquid in nature.  States he has been taking prunes, prune juice and over-the-counter medications but he cannot remember what all he is taking for his constipation.  He denies any abdominal pain currently.  No longer having any nausea.  No fever.   History provided by patient.    Past Medical History:  Diagnosis Date   Atrial fibrillation (HCC)    a.) CHA2DS2-VASc = 4 (age x 2, CHF, HTN). b.) rate/rhythm maintained on oral metoprolol succinate; chronically anticoagulated with apixaban.   B12 deficiency    BPH associated with nocturia    BPPV (benign paroxysmal positional vertigo)    CHF (congestive heart failure) (HCC)    CKD (chronic kidney disease), stage III (HCC)    Glaucoma    Incomplete right bundle branch block (RBBB)    Long term current use of anticoagulant    a.) apixaban   Macular degeneration     Past Surgical History:  Procedure Laterality Date   COLONOSCOPY W/ POLYPECTOMY     GREEN LIGHT LASER TURP (TRANSURETHRAL RESECTION OF PROSTATE N/A 10/19/2021   Procedure: GREEN LIGHT LASER TURP (TRANSURETHRAL RESECTION OF PROSTATE;  Surgeon: Orson Ape, MD;  Location: [facility] ORS;  Service: Urology;  Laterality: N/A;    MEDICATIONS:  Prior to Admission medications   Medication Sig Start Date End Date Taking? Authorizing Provider  acetaminophen (TYLENOL) 500 MG tablet Take 1,000 mg by mouth every 6 (six) hours as needed.    [provider]   albuterol (VENTOLIN HFA) 108 (90 Base) MCG/ACT inhaler Inhale 1-2 puffs into the lungs every 6 (six) hours as needed for wheezing or shortness of breath. 01/13/23   Berdie Malter, Tylene Fantasia, PA-C  apixaban (ELIQUIS) 5 MG TABS tablet TAKE 1 TABLET(5 MG) BY MOUTH TWICE DAILY 01/07/23   Antonieta Iba, MD  ascorbic acid (VITAMIN C) 500 MG tablet Take 500 mg by mouth daily.    [provider]  benzonatate (TESSALON) 100 MG capsule Take 1 capsule (100 mg total) by mouth every 8 (eight) hours. 01/13/23   Kirsten Spearing, Tylene Fantasia, PA-C  brimonidine (ALPHAGAN) 0.2 % ophthalmic solution 1 drop two (2) times a day.    [provider]  Cyanocobalamin (VITAMIN B-12) 2500 MCG SUBL Place under the tongue.    [provider]  docusate sodium (COLACE) 100 MG capsule Take 2 capsules (200 mg total) by mouth 2 (two) times daily. 10/19/21   Orson Ape, MD  finasteride (PROSCAR) 5 MG tablet Take 1 tablet (5 mg total) by mouth daily. 10/16/22   Stoioff, Verna Czech, MD  furosemide (LASIX) 20 MG tablet Take 1 tablet (20 mg total) by mouth daily as needed for edema. 01/10/22   Antonieta Iba, MD  ketoconazole (NIZORAL) 2 % shampoo Shampoo into the scalp let sit 5-10 minutes then wash out. Use 2-3 days per week. 03/27/21   Armida Sans  C, MD  metoprolol succinate (TOPROL-XL) 50 MG 24 hr tablet Take 1 tablet (50 mg total) by mouth every evening. 07/16/22   Antonieta Iba, MD  tamsulosin (FLOMAX) 0.4 MG CAPS capsule Take 0.4 mg by mouth daily after breakfast.    [provider]  terazosin (HYTRIN) 10 MG capsule Take by mouth.    [provider]  Zinc Picolinate POWD Take by mouth.    [provider]    Physical Exam   Triage Vital Signs: ED Triage Vitals  Encounter Vitals Group     BP 01/30/23 2212 121/89     Systolic BP Percentile --      Diastolic BP Percentile --      Pulse --      Resp 01/30/23 2212 20     Temp 01/30/23 2212 (!) 97.5 F (36.4 C)     Temp Source 01/30/23  2212 Oral     SpO2 01/30/23 2205 100 %     Weight 01/30/23 2210 175 lb (79.4 kg)     Height 01/30/23 2210 5\' 8"  (1.727 m)     Head Circumference --      Peak Flow --      Pain Score 01/30/23 2210 8     Pain Loc --      Pain Education --      Exclude from Growth Chart --     Most recent vital signs: Vitals:   01/31/23 0340 01/31/23 0341  BP:    Pulse:    Resp:    Temp:  97.8 F (36.6 C)  SpO2: 97%     CONSTITUTIONAL: Alert, responds appropriately to questions. Well-appearing; well-nourished, elderly HEAD: Normocephalic, atraumatic EYES: Conjunctivae clear, pupils appear equal, sclera nonicteric ENT: normal nose; moist mucous membranes NECK: Supple, normal ROM CARD: RRR; S1 and S2 appreciated RESP: Normal chest excursion without splinting or tachypnea; breath sounds clear and equal bilaterally; no wheezes, no rhonchi, no rales, no hypoxia or respiratory distress, speaking full sentences ABD/GI: Non-distended; soft, non-tender, no rebound, no guarding, no peritoneal signs BACK: The back appears normal EXT: Normal ROM in all joints; no deformity noted, no edema SKIN: Normal color for age and race; warm; no rash on exposed skin NEURO: Moves all extremities equally, normal speech PSYCH: The patient's mood and manner are appropriate.   ED Results / Procedures / Treatments   LABS: (all labs ordered are listed, but only abnormal results are displayed) Labs Reviewed  RESP PANEL BY RT-PCR (RSV, FLU A&B, COVID)  RVPGX2 - Abnormal; Notable for the following components:      Result Value   SARS Coronavirus 2 by RT PCR POSITIVE (*)    All other components within normal limits  COMPREHENSIVE METABOLIC PANEL - Abnormal; Notable for the following components:   Glucose, Bld 176 (*)    Total Bilirubin 1.3 (*)    GFR, Estimated 55 (*)    All other components within normal limits  CBC - Abnormal; Notable for the following components:   WBC 15.0 (*)    All other components within  normal limits  LIPASE, BLOOD     EKG:   RADIOLOGY: My personal review and interpretation of imaging: CT scan shows mild constipation, no colitis.  I have personally reviewed all radiology reports.   CT ABDOMEN PELVIS W CONTRAST  Result Date: 01/30/2023 CLINICAL DATA:  Abdominal pain, constipation EXAM: CT ABDOMEN AND PELVIS WITH CONTRAST TECHNIQUE: Multidetector CT imaging of the abdomen and pelvis was performed using the  standard protocol following bolus administration of intravenous contrast. RADIATION DOSE REDUCTION: This exam was performed according to the departmental dose-optimization program which includes automated exposure control, adjustment of the mA and/or kV according to patient size and/or use of iterative reconstruction technique. CONTRAST:  OMNIPAQUE IOHEXOL 300 MG/ML  SOLN COMPARISON:  None Available. FINDINGS: Lower chest: Lung bases are clear. Hepatobiliary: Liver is within normal limits. Gallbladder is unremarkable. No intrahepatic or extrahepatic ductal dilatation. Pancreas: Within normal limits. Spleen: Within normal limits. Adrenals/Urinary Tract: Adrenal glands within normal limits. Bilateral renal cysts, measuring up to 3.5 cm in the posterior left lower kidney (series 2/image 46), benign (Bosniak I). No follow-up is recommended. No hydronephrosis. Bladder is within normal limits. Stomach/Bowel: Stomach is notable for a small hiatal hernia. No evidence of bowel obstruction. Normal appendix (series 2/image 61). Moderate left colonic stool burden, suggesting mild constipation. No colonic wall thickening or inflammatory changes to suggest stercoral colitis. Vascular/Lymphatic: No evidence of abdominal aortic aneurysm. Atherosclerotic calcifications of the abdominal aorta and branch vessels, although vessels remain patent. No suspicious abdominopelvic lymphadenopathy. Reproductive: Prostatomegaly, suggesting BPH. Other: No abdominopelvic ascites. Musculoskeletal: Degenerative  changes of the visualized thoracolumbar spine. IMPRESSION: Moderate left colonic stool burden, suggesting mild constipation. No evidence of stercoral colitis. Prostatomegaly, suggesting BPH. Electronically Signed   By: Charline Bills M.D.   On: 01/30/2023 23:54     PROCEDURES:  Critical Care performed: No   CRITICAL CARE Performed by: Baxter Hire Kanye Depree   Total critical care time: 0 minutes  Critical care time was exclusive of separately billable procedures and treating other patients.  Critical care was necessary to treat or prevent imminent or life-threatening deterioration.  Critical care was time spent personally by me on the following activities: development of treatment plan with patient and/or surrogate as well as nursing, discussions with consultants, evaluation of patient's response to treatment, examination of patient, obtaining history from patient or surrogate, ordering and performing treatments and interventions, ordering and review of laboratory studies, ordering and review of radiographic studies, pulse oximetry and re-evaluation of patient's condition.   Procedures    IMPRESSION / MDM / ASSESSMENT AND PLAN / ED COURSE  I reviewed the triage vital signs and the nursing notes.    Patient here for constipation.   DIFFERENTIAL DIAGNOSIS (includes but not limited to):   Constipation, bowel obstruction, colitis   Patient's presentation is most consistent with acute presentation with potential threat to life or bodily function.   PLAN: Labs obtained from triage show leukocytosis of 15,000.  Normal electrolytes, renal function, LFTs and lipase.  Patient is COVID-positive but was COVID-positive 3 weeks ago.  He is not having any URI symptoms or fever.  CT of the abdomen pelvis reviewed and interpreted by myself and the radiologist and shows mild constipation but no fecal impaction, colitis.  He was able to have a bowel movement in triage.  He did have some vomiting but  nausea has resolved.  Will give Zofran and p.o. challenge.  Abdominal exam is benign.  I feel he is safe for discharge home.  Recommended he hold medications for constipation if he is still having loose stools but have a and have information on what he can do at home for constipation in the future including a high-fiber diet.  Will discharge with Zofran.  I feel he is safe to go home.   MEDICATIONS GIVEN IN ED: Medications  ondansetron (ZOFRAN) injection 4 mg (4 mg Intravenous Given 01/30/23 2220)  iohexol (OMNIPAQUE) 300 MG/ML  solution 100 mL (100 mLs Intravenous Contrast Given 01/30/23 2335)  ondansetron (ZOFRAN) injection 4 mg (4 mg Intravenous Given 01/31/23 0345)     ED COURSE:  At this time, I do not feel there is any life-threatening condition present. I reviewed all nursing notes, vitals, pertinent previous records.  All lab and urine results, EKGs, imaging ordered have been independently reviewed and interpreted by myself.  I reviewed all available radiology reports from any imaging ordered this visit.  Based on my assessment, I feel the patient is safe to be discharged home without further emergent workup and can continue workup as an outpatient as needed. Discussed all findings, treatment plan as well as usual and customary return precautions.  They verbalize understanding and are comfortable with this plan.  Outpatient follow-up has been provided as needed.  All questions have been answered.    CONSULTS:  none   OUTSIDE RECORDS REVIEWED: Reviewed last urology note in March 2024.       FINAL CLINICAL IMPRESSION(S) / ED DIAGNOSES   Final diagnoses:  Constipation, unspecified constipation type     Rx / DC Orders   ED Discharge Orders          Ordered    ondansetron (ZOFRAN-ODT) 4 MG disintegrating tablet  Every 6 hours PRN        01/31/23 0332             Note:  This document was prepared using Dragon voice recognition software and may include unintentional  dictation errors.   Pike Scantlebury, Layla Maw, DO 01/31/23 [phone]

## 2023-01-31 NOTE — ED Notes (Signed)
Pt had soiled self. Writer brought pt to bathroom and cleaned pt. Pt placed in brief

## 2023-01-31 NOTE — ED Notes (Signed)
This EDT went to check this pts vitals. Pt stated "I have made a mess." This EDT saw that Pt was incontinent of brown liquid and formed stool in ED lobby. Pt was taken to bathroom by this EDT and EDT, Taylour. First nurse and EVS notified. Lobby cleaned up. Pt is in bathroom at this time still incontinent of stool and vomiting in the bathroom in triage. Pt was given call bell and told to call when PT is ready to be cleaned up. This EDT and EDT, Taylour will be assisting in cleaning when PT is ready.

## 2023-01-31 NOTE — Discharge Instructions (Addendum)
I recommend that you increase your water and fiber intake. If you are not able to eat foods high in fiber, you may use Benefiber or Metamucil over-the-counter. I also recommend you use MiraLAX 1-2 times a day and Colace 100 mg twice a day to help with bowel movements. These medications are over the counter.  You may use other over-the-counter medications such as Dulcolax, Fleet enemas, magnesium citrate as needed for constipation. Please note that some of these medications may cause you to have abdominal cramping which is normal. If you develop severe abdominal pain, fever (temperature of 100.4 or higher), persistent vomiting, distention of your abdomen, unable to have a bowel movement for 5 days or are not passing gas, please return to the hospital.  Please do not take these medications however if you are having diarrhea.

## 2023-06-21 ENCOUNTER — Other Ambulatory Visit: Payer: Self-pay | Admitting: Cardiovascular Disease

## 2023-07-26 ENCOUNTER — Ambulatory Visit: Payer: Medicare Other | Admitting: Urology

## 2023-07-26 ENCOUNTER — Encounter: Payer: Self-pay | Admitting: Urology

## 2023-07-26 VITALS — BP 120/74 | HR 78 | Ht 67.0 in | Wt 182.0 lb

## 2023-07-26 DIAGNOSIS — N401 Enlarged prostate with lower urinary tract symptoms: Secondary | ICD-10-CM | POA: Diagnosis not present

## 2023-07-26 DIAGNOSIS — R350 Frequency of micturition: Secondary | ICD-10-CM

## 2023-07-26 DIAGNOSIS — R972 Elevated prostate specific antigen [PSA]: Secondary | ICD-10-CM | POA: Diagnosis not present

## 2023-07-26 LAB — URINALYSIS, COMPLETE
Bilirubin, UA: NEGATIVE
Glucose, UA: NEGATIVE
Leukocytes,UA: NEGATIVE
Nitrite, UA: NEGATIVE
RBC, UA: NEGATIVE
Specific Gravity, UA: 1.025 (ref 1.005–1.030)
Urobilinogen, Ur: 1 mg/dL (ref 0.2–1.0)
pH, UA: 5.5 (ref 5.0–7.5)

## 2023-07-26 LAB — MICROSCOPIC EXAMINATION
Bacteria, UA: NONE SEEN
RBC, Urine: NONE SEEN /HPF (ref 0–2)

## 2023-07-26 MED ORDER — FINASTERIDE 5 MG PO TABS
5.0000 mg | ORAL_TABLET | Freq: Every day | ORAL | 3 refills | Status: AC
Start: 2023-07-26 — End: ?

## 2023-07-26 NOTE — Progress Notes (Signed)
 I, Johnathan Byrd, acting as a scribe for Riki Altes, MD., have documented all relevant documentation on the behalf of Riki Altes, MD, as directed by Riki Altes, MD while in the presence of Riki Altes, MD.  07/26/2023 10:32 AM   Johnathan Byrd Feb 02, 1931 [id number]  Referring provider: Kandyce Rud, MD [phone] [facility] [place],  [place] [address]  Chief Complaint  Patient presents with   Other   Urologic history 1. BPH with LUTS PVP Dr. Evelene Croon 10/19/21.  Combination therapy tamsulosin/finasteride.   2. Elevated PSA Prostate MRI 08/19/21 for an uncorrected PSA of 4.8, which showed a 193 gram prostate and a PIRADS III lesions. He elected not to pursue biopsy. Several prior benign prostate biopsies.   HPI: Johnathan Byrd is a 89 y.o. male presents for annual follow-up.   Previously followed by Dr. Evelene Croon and was seen for transfer of care 1 year ago. Shortly after his last visit, he discontinued his tamsulosin and finasteride and has not seen any worsening of his voiding symptoms. He does have urinary frequency. which he attributes to staying hydrated.   PMH: Past Medical History:  Diagnosis Date   Atrial fibrillation (HCC)    a.) CHA2DS2-VASc = 4 (age x 2, CHF, HTN). b.) rate/rhythm maintained on oral metoprolol succinate; chronically anticoagulated with apixaban.   B12 deficiency    BPH associated with nocturia    BPPV (benign paroxysmal positional vertigo)    CHF (congestive heart failure) (HCC)    CKD (chronic kidney disease), stage III (HCC)    Glaucoma    Incomplete right bundle branch block (RBBB)    Long term current use of anticoagulant    a.) apixaban   Macular degeneration     Surgical History: Past Surgical History:  Procedure Laterality Date   COLONOSCOPY W/ POLYPECTOMY     GREEN LIGHT LASER TURP (TRANSURETHRAL RESECTION OF PROSTATE N/A 10/19/2021   Procedure: GREEN LIGHT LASER TURP  (TRANSURETHRAL RESECTION OF PROSTATE;  Surgeon: Orson Ape, MD;  Location: [facility] ORS;  Service: Urology;  Laterality: N/A;    Home Medications:  Allergies as of 07/26/2023       Reactions   Other Other (See Comments)   Unknown Unknown   Sulfa Antibiotics         Medication List        Accurate as of July 26, 2023 10:32 AM. If you have any questions, ask your nurse or doctor.          STOP taking these medications    acetaminophen 500 MG tablet Commonly known as: TYLENOL Stopped by: Riki Altes   benzonatate 100 MG capsule Commonly known as: TESSALON Stopped by: Riki Altes   furosemide 20 MG tablet Commonly known as: Lasix Stopped by: Johnathan Byrd Johnathan Byrd   ondansetron 4 MG disintegrating tablet Commonly known as: ZOFRAN-ODT Stopped by: Riki Altes   tamsulosin 0.4 MG Caps capsule Commonly known as: FLOMAX Stopped by: Riki Altes       TAKE these medications    albuterol 108 (90 Base) MCG/ACT inhaler Commonly known as: VENTOLIN HFA Inhale 1-2 puffs into the lungs every 6 (six) hours as needed for wheezing or shortness of breath.   ascorbic acid 500 MG tablet Commonly known as: VITAMIN C Take 500 mg by mouth daily.   brimonidine 0.2 % ophthalmic solution Commonly known as: ALPHAGAN 1 drop two (2) times  a day.   docusate sodium 100 MG capsule Commonly known as: COLACE Take 2 capsules (200 mg total) by mouth 2 (two) times daily.   Eliquis 5 MG Tabs tablet Generic drug: apixaban TAKE 1 TABLET(5 MG) BY MOUTH TWICE DAILY   finasteride 5 MG tablet Commonly known as: PROSCAR Take 1 tablet (5 mg total) by mouth daily.   ketoconazole 2 % shampoo Commonly known as: NIZORAL Shampoo into the scalp let sit 5-10 minutes then wash out. Use 2-3 days per week.   metoprolol succinate 50 MG 24 hr tablet Commonly known as: TOPROL-XL TAKE 1 TABLET(50 MG) BY MOUTH EVERY EVENING   terazosin 10 MG capsule Commonly known as: HYTRIN Take by  mouth.   Vitamin B-12 2500 MCG Subl Place under the tongue.   Zinc Picolinate Powd Take by mouth.        Allergies:  Allergies  Allergen Reactions   Other Other (See Comments)    Unknown  Unknown   Sulfa Antibiotics     Family History: Family History  Problem Relation Age of Onset   Alzheimer's disease Mother    Stroke Father     Social History:  reports that he has never smoked. He has never used smokeless tobacco. He reports that he does not drink alcohol and does not use drugs.   Physical Exam: BP 120/74   Pulse 78   Ht 5\' 7"  (1.702 m)   Wt 182 lb (82.6 kg)   BMI 28.51 kg/m   Constitutional:  Alert and oriented, No acute distress. HEENT: [place] AT Respiratory: Normal respiratory effort, no increased work of breathing. Psychiatric: Normal mood and affect.   Urinalysis Dipstick traced ketone/1+ protein, microscopy negative.   Assessment & Plan:    1. BPH with LUTS Recent prostate MRI showed 193 gram prostate and recommended he go back on finasteride, as he may have a prophylactic benefit from this medication. Refill sent to pharmacy  2. Elevated PSA He requested a PSA today. We discussed since he has been off finasteride will expect his PSA to be 50% higher. Will notify with results.   I have reviewed the above documentation for accuracy and completeness, and I agree with the above.   Riki Altes, MD  [facility] [address], [address] [place], [place] [address] [phone]

## 2023-07-27 LAB — PSA: Prostate Specific Ag, Serum: 6.2 ng/mL — ABNORMAL HIGH (ref 0.0–4.0)

## 2023-08-01 ENCOUNTER — Telehealth: Payer: Self-pay | Admitting: Cardiovascular Disease

## 2023-08-01 ENCOUNTER — Other Ambulatory Visit: Payer: Self-pay | Admitting: Cardiovascular Disease

## 2023-08-01 ENCOUNTER — Telehealth: Payer: Self-pay | Admitting: Urology

## 2023-08-01 DIAGNOSIS — I4891 Unspecified atrial fibrillation: Secondary | ICD-10-CM

## 2023-08-01 NOTE — Telephone Encounter (Signed)
 Left message that his psa was 6.2 on voice mail per [facility]

## 2023-08-01 NOTE — Telephone Encounter (Signed)
 Pt called asking about PSA results from last Friday.  I didn't see any results.

## 2023-08-01 NOTE — Telephone Encounter (Signed)
 Pt scheduled appt on 08/22/23 with [place], NP. Refill sent.

## 2023-08-01 NOTE — Telephone Encounter (Signed)
-----   Message from Nurse Cicero Duck B sent at 08/01/2023 11:55 AM EDT ----- Pt last saw Dr Mariah Milling 07/16/22, pt is overdue for follow-up.  Recall in Epic for 1 year follow-up.  Please contact pt to schedule follow-up appt, pt requesting prescription refills. Thank you!

## 2023-08-01 NOTE — Telephone Encounter (Signed)
 Pt last saw Dr Mariah Milling 07/16/22, pt is overdue for follow-up.  Recall in Epic, msg sent to schedulers to contact pt for follow-up appt.  Last labs 06/18/23 Creat 1.2, age 89, weight 82.6kg, based on specified criteria pt is on appropriate dosage of Eliquis 5mg  BID for afib.  Will await follow-up appt to be made and refill to get pt to that appt.

## 2023-08-01 NOTE — Telephone Encounter (Signed)
-----   Message from Nurse Cicero Duck B sent at 08/01/2023 11:55 AM EDT ----- Johnathan Byrd last saw Dr Mariah Milling 07/16/22, Johnathan Byrd is overdue for follow-up.  Recall in Epic for 1 year follow-up.  Please contact Johnathan Byrd to schedule follow-up appt, Johnathan Byrd requesting prescription refills. Thank you!

## 2023-08-01 NOTE — Telephone Encounter (Signed)
LVM to schedule appt, please schedule 

## 2023-08-09 ENCOUNTER — Encounter: Payer: Self-pay | Admitting: Urology

## 2023-08-09 ENCOUNTER — Ambulatory Visit: Admitting: Urology

## 2023-08-09 VITALS — BP 116/76 | HR 80 | Ht 67.0 in | Wt 183.0 lb

## 2023-08-09 DIAGNOSIS — N401 Enlarged prostate with lower urinary tract symptoms: Secondary | ICD-10-CM | POA: Diagnosis not present

## 2023-08-09 DIAGNOSIS — R972 Elevated prostate specific antigen [PSA]: Secondary | ICD-10-CM | POA: Diagnosis not present

## 2023-08-09 DIAGNOSIS — R300 Dysuria: Secondary | ICD-10-CM

## 2023-08-09 LAB — URINALYSIS, COMPLETE
Bilirubin, UA: NEGATIVE
Glucose, UA: NEGATIVE
Ketones, UA: NEGATIVE
Leukocytes,UA: NEGATIVE
Nitrite, UA: NEGATIVE
Specific Gravity, UA: 1.025 (ref 1.005–1.030)
Urobilinogen, Ur: 1 mg/dL (ref 0.2–1.0)
pH, UA: 5.5 (ref 5.0–7.5)

## 2023-08-09 LAB — MICROSCOPIC EXAMINATION

## 2023-08-09 LAB — BLADDER SCAN AMB NON-IMAGING: Scan Result: 11

## 2023-08-09 MED ORDER — CEFUROXIME AXETIL 250 MG PO TABS: 250.0000 mg | ORAL_TABLET | Freq: Two times a day (BID) | ORAL | 0 refills | Status: AC

## 2023-08-09 NOTE — Progress Notes (Signed)
 I, Maysun Anabel Bene, acting as a scribe for Riki Altes, MD., have documented all relevant documentation on the behalf of Riki Altes, MD, as directed by Riki Altes, MD while in the presence of Riki Altes, MD.  08/09/2023 4:30 PM   [name] 1930/09/04 [id number]  Referring provider: Kandyce Rud, MD [id number] S. [facility] [place],  [place] [address]  Chief Complaint  Patient presents with   Dysuria   Urologic history: 1.  BPH with LUTS Tamsulosin/finasteride    2.  Elevated PSA  HPI: Johnathan Byrd is a 89 y.o. male called for a follow-up appointment secondary to dysuria.  Seen 07/26/2023 for annual follow-up and had been off tamsulosin and finasteride for several months. Prostate MRI April 2023 showed a 193 gram prostate and no lesions suspicious for high-grade prostate cancer. He has had previous benign prostate biopsies and elected not to pursue a repeat biopsy. He had requested a PSA and was informed that it would be 50% higher since he had stopped finasteride and the PSA had an appropriate increase at 6.2.  Shortly after his visit on 3/14, he developed dysuria and saw his PCP on 08/07/23 with a 3 day history of dysuria His urinalysis was unremarkable. His PSA was repeated and was elevated at 53.54. It was unclear why his PSA was repeated 1 week after I had ordered, and the patient stated he had requested to have it drawn. He has had some increased urgency, voiding small amounts and intermittent urge incontinence. Also complains of lower abdominal discomfort.  No fever, chills, or gross hematuria.   PMH: Past Medical History:  Diagnosis Date   Atrial fibrillation (HCC)    a.) CHA2DS2-VASc = 4 (age x 2, CHF, HTN). b.) rate/rhythm maintained on oral metoprolol succinate; chronically anticoagulated with apixaban.   B12 deficiency    BPH associated with nocturia    BPPV (benign paroxysmal positional  vertigo)    CHF (congestive heart failure) (HCC)    CKD (chronic kidney disease), stage III (HCC)    Glaucoma    Incomplete right bundle branch block (RBBB)    Long term current use of anticoagulant    a.) apixaban   Macular degeneration     Surgical History: Past Surgical History:  Procedure Laterality Date   COLONOSCOPY W/ POLYPECTOMY     GREEN LIGHT LASER TURP (TRANSURETHRAL RESECTION OF PROSTATE N/A 10/19/2021   Procedure: GREEN LIGHT LASER TURP (TRANSURETHRAL RESECTION OF PROSTATE;  Surgeon: Orson Ape, MD;  Location: [facility] ORS;  Service: Urology;  Laterality: N/A;    Home Medications:  Allergies as of 08/09/2023       Reactions   Other Other (See Comments)   Unknown Unknown   Sulfa Antibiotics         Medication List        Accurate as of August 09, 2023  4:30 PM. If you have any questions, ask your nurse or doctor.          STOP taking these medications    albuterol 108 (90 Base) MCG/ACT inhaler Commonly known as: VENTOLIN HFA Stopped by: Riki Altes   ascorbic acid 500 MG tablet Commonly known as: VITAMIN C Stopped by: Riki Altes   ketoconazole 2 % shampoo Commonly known as: NIZORAL Stopped by: Riki Altes   terazosin 10 MG capsule Commonly known as: HYTRIN Stopped by: Riki Altes  TAKE these medications    brimonidine 0.2 % ophthalmic solution Commonly known as: ALPHAGAN 1 drop two (2) times a day.   cefUROXime 250 MG tablet Commonly known as: CEFTIN Take 1 tablet (250 mg total) by mouth 2 (two) times daily with a meal for 7 days. Started by: Riki Altes   docusate sodium 100 MG capsule Commonly known as: COLACE Take 2 capsules (200 mg total) by mouth 2 (two) times daily.   Eliquis 5 MG Tabs tablet Generic drug: apixaban TAKE 1 TABLET(5 MG) BY MOUTH TWICE DAILY   finasteride 5 MG tablet Commonly known as: PROSCAR Take 1 tablet (5 mg total) by mouth daily.   metoprolol succinate 50 MG 24 hr  tablet Commonly known as: TOPROL-XL TAKE 1 TABLET(50 MG) BY MOUTH EVERY EVENING   Vitamin B-12 2500 MCG Subl Place under the tongue.   Zinc Picolinate Powd Take by mouth.        Allergies:  Allergies  Allergen Reactions   Other Other (See Comments)    Unknown  Unknown   Sulfa Antibiotics     Family History: Family History  Problem Relation Age of Onset   Alzheimer's disease Mother    Stroke Father     Social History:  reports that he has never smoked. He has never used smokeless tobacco. He reports that he does not drink alcohol and does not use drugs.   Physical Exam: BP 116/76   Pulse 80   Ht 5\' 7"  (1.702 m)   Wt 183 lb (83 kg)   BMI 28.66 kg/m   Constitutional:  Alert and oriented, No acute distress. HEENT: [place] AT Respiratory: Normal respiratory effort, no increased work of breathing. Psychiatric: Normal mood and affect.  Urinalysis Dipstick trace blood/ 2+ protein, microscopy 0-5 WBC, 3-10 RBC    Assessment & Plan:    1. Dysuria  UA today with microhematuria. Urine culture was ordered, and based on his symptoms, will start empiric Cefuroxime 250 mg BID x7 days.  He will be notified with the culture results.   2. Elevated PSA He had an appropriate increase in his PSA and I informed Johnathan Byrd, there was no reason to request a repeat PSA, and I would not have recommended  His repeat PSA was >50 and with his ongoing symptoms, most likely secondary to infection/inflammation.  3. BPH with LUTS He has restarted his finasteride, but it is unclear if he restarted his tamsulosin. If he is not taken, I recommend he restart.  I have reviewed the above documentation for accuracy and completeness, and I agree with the above.   Riki Altes, MD  [facility] [address] [place], [place] [address] [phone]

## 2023-08-14 LAB — CULTURE, URINE COMPREHENSIVE

## 2023-08-15 ENCOUNTER — Other Ambulatory Visit: Payer: Self-pay | Admitting: *Deleted

## 2023-08-15 MED ORDER — AMOXICILLIN 875 MG PO TABS
875.0000 mg | ORAL_TABLET | Freq: Two times a day (BID) | ORAL | 0 refills | Status: DC
Start: 2023-08-15 — End: 2023-08-23

## 2023-08-17 NOTE — Progress Notes (Deleted)
   Cardiology Clinic Note   Date: 08/17/2023 ID: Johnathan Byrd, DOB [birth year], MRN [id number]  Primary Cardiologist:  None  Chief Complaint   Johnathan Byrd is a 89 y.o. male who presents to the clinic today for ***  Patient Profile   Johnathan Byrd is followed by *** for the history outlined below.      Past medical history significant for: Chronic diastolic heart failure. Echo 07/10/2021 (performed at [facility]): EF 65%.  Normal RV size/function.  Moderate BAE.  No significant valvular abnormalities. Permanent A-fib. Onset 2013. 3-day ZIO 08/24/2020: HR 44 to 141 bpm, average 77 bpm.  100% A-fib burden.  1 pause lasting 3 seconds at 3:39 AM.  No daytime pauses. Hypertension.  In summary, patient was previously followed by cardiologist at [facility].  He was first evaluated by Dr. Mariah Milling on 01/10/2022 to establish care after moving to [facility] independent living.  Patient has a history of A-fib with onset in 2013.  He had a normal, low risk Lexiscan performed at [facility] in September 2017.  He underwent hospital admission at [facility] in October 2019 for A-fib and diastolic heart failure in the setting of pneumonia.  Patient was last seen in the office by Dr. Mariah Milling on 07/16/2022 for routine follow-up.  He was doing well at that time with no complaints.  No medication changes were made.     History of Present Illness    Today, patient ***  Chronic diastolic heart failure Echo February 2023 showed normal LV/RV function, moderate BAE, no significant valvular abnormalities.  Patient*** Euvolemic and well compensated on exam. -Continue Toprol.  Permanent A-fib Onset 2013.  3-day ZIO April 2022 showed HR 44 to 141 bpm, average 77 bpm, 100% A-fib burden, 1 pause lasting 3 seconds at 3:39 AM, no additional pauses.  No spontaneous bleeding concerns.  Patient*** -Continue Toprol and Eliquis. Appropriate Eliquis dose.  Hypertension BP today*** -Continue Toprol.  ROS: All other  systems reviewed and are otherwise negative except as noted in History of Present Illness.  EKGs/Labs Reviewed        01/30/2023: ALT 18; AST 22; BUN 22; Creatinine, Ser 1.24; Potassium 4.1; Sodium 138   01/30/2023: Hemoglobin 16.6; WBC 15.0   No results found for requested labs within last 365 days.   No results found for requested labs within last 365 days.  ***  Risk Assessment/Calculations    {Does this patient have ATRIAL FIBRILLATION?:[phone]} No BP recorded.  {Refresh Note OR Click here to enter BP  :1}***        Physical Exam    VS:  There were no vitals taken for this visit. , BMI There is no height or weight on file to calculate BMI.  GEN: Well nourished, well developed, in no acute distress. Neck: No JVD or carotid bruits. Cardiac: *** RRR. No murmurs. No rubs or gallops.   Respiratory:  Respirations regular and unlabored. Clear to auscultation without rales, wheezing or rhonchi. GI: Soft, nontender, nondistended. Extremities: Radials/DP/PT 2+ and equal bilaterally. No clubbing or cyanosis. No edema ***  Skin: Warm and dry, no rash. Neuro: Strength intact.  Assessment & Plan   ***  Disposition: ***     {Are you ordering a CV Procedure (e.g. stress test, cath, DCCV, TEE, etc)?   Press F2        :[id number]}   Signed, Etta Grandchild. Tyland Klemens, DNP, NP-C

## 2023-08-18 ENCOUNTER — Emergency Department
Admission: EM | Admit: 2023-08-18 | Discharge: 2023-08-19 | Disposition: A | Discharge status: Home / Self Care | Attending: Emergency Medicine | Admitting: Emergency Medicine

## 2023-08-18 ENCOUNTER — Emergency Department

## 2023-08-18 ENCOUNTER — Other Ambulatory Visit: Payer: Self-pay

## 2023-08-18 DIAGNOSIS — R066 Hiccough: Secondary | ICD-10-CM | POA: Insufficient documentation

## 2023-08-18 DIAGNOSIS — R0602 Shortness of breath: Secondary | ICD-10-CM | POA: Diagnosis not present

## 2023-08-18 DIAGNOSIS — R748 Abnormal levels of other serum enzymes: Secondary | ICD-10-CM | POA: Diagnosis not present

## 2023-08-18 LAB — BASIC METABOLIC PANEL WITH GFR
Anion gap: 9 (ref 5–15)
BUN: 20 mg/dL (ref 8–23)
CO2: 20 mmol/L — ABNORMAL LOW (ref 22–32)
Calcium: 9 mg/dL (ref 8.9–10.3)
Chloride: 103 mmol/L (ref 98–111)
Creatinine, Ser: 1.06 mg/dL (ref 0.61–1.24)
GFR, Estimated: >60 mL/min (ref >60)
Glucose, Bld: 137 mg/dL — ABNORMAL HIGH (ref 70–99)
Potassium: 4.2 mmol/L (ref 3.5–5.1)
Sodium: 132 mmol/L — ABNORMAL LOW (ref 135–145)

## 2023-08-18 LAB — CBC
HCT: 41.6 % (ref 39.0–52.0)
Hemoglobin: 14.2 g/dL (ref 13.0–17.0)
MCH: 32 pg (ref 26.0–34.0)
MCHC: 34.1 g/dL (ref 30.0–36.0)
MCV: 93.7 fL (ref 80.0–100.0)
Platelets: 219 10*3/uL (ref 150–400)
RBC: 4.44 MIL/uL (ref 4.22–5.81)
RDW: 12.9 % (ref 11.5–15.5)
WBC: 6.9 10*3/uL (ref 4.0–10.5)
nRBC: 0 % (ref 0.0–0.2)

## 2023-08-18 LAB — URINALYSIS, ROUTINE W REFLEX MICROSCOPIC
Bacteria, UA: NONE SEEN
Bilirubin Urine: NEGATIVE
Glucose, UA: NEGATIVE mg/dL
Ketones, ur: NEGATIVE mg/dL
Leukocytes,Ua: NEGATIVE
Nitrite: NEGATIVE
Protein, ur: 100 mg/dL — AB
RBC / HPF: >50 RBC/hpf (ref 0–5)
Specific Gravity, Urine: 1.021 (ref 1.005–1.030)
pH: 5 (ref 5.0–8.0)

## 2023-08-18 LAB — LIPASE, BLOOD: Lipase: 88 U/L — ABNORMAL HIGH (ref 11–51)

## 2023-08-18 LAB — TROPONIN I (HIGH SENSITIVITY)
Troponin I (High Sensitivity): 8 ng/L (ref <18)
Troponin I (High Sensitivity): 9 ng/L (ref <18)

## 2023-08-18 MED ORDER — CHLORPROMAZINE HCL 25 MG PO TABS
25.0000 mg | ORAL_TABLET | ORAL | Status: AC
  Administered 2023-08-19: 25 mg via ORAL
  Filled 2023-08-18: qty 1

## 2023-08-18 NOTE — ED Triage Notes (Signed)
 Pt to Ed via POV from [facility]. Pt reports hiccups x3 days and intermittent SOB. Pt reports he has been having trouble sleeping due to the hiccups. Pt is on Eliquis for afib.   Pt also reports just finished amoxacillin for UTI and pt reports still having blood in his urine.

## 2023-08-18 NOTE — ED Provider Notes (Signed)
 [facility] Provider Note    Event Date/Time   First MD Initiated Contact with Patient 08/18/23 2254     (approximate)   History   Hiccups and Shortness of Breath   HPI Johnathan Byrd is a 89 y.o. male who presents for evaluation of several concerns.  He has had persistent hiccups for 3 days which is causing him to feel short of breath and not be able to sleep well.  He and his wife are concerned because his blood pressure has been elevated as well and he had a higher heart rate than usual when he was home at their assisted living facility.  The staff at the facility recommended he come to the emergency department.  He is in good spirits despite the hiccups.  Several years ago after a prostate surgery he had hiccups for several weeks.  He has had no choking episodes or foreign bodies of which he is aware.  No ear pain or foreign bodies in the ear.  No lightheadedness or dizziness.  He is only short of breath when he hiccups too much.  He also has concerns about ongoing hematuria.  He is a patient of Dr. Lonna Cobb and has been treated with antibiotics recently but he is still passing blood in his urine.  Of note, he has chronic atrial fibrillation and takes Eliquis.  He reports no pain when he urinates.  No recent fever.     Physical Exam   Triage Vital Signs: ED Triage Vitals  Encounter Vitals Group     BP 08/18/23 1823 (!) 145/93     Systolic BP Percentile --      Diastolic BP Percentile --      Pulse Rate 08/18/23 1823 100     Resp 08/18/23 1823 20     Temp 08/18/23 1823 98 F (36.7 C)     Temp Source 08/18/23 1823 Oral     SpO2 08/18/23 1823 100 %     Weight --      Height --      Head Circumference --      Peak Flow --      Pain Score 08/18/23 1820 0     Pain Loc --      Pain Education --      Exclude from Growth Chart --     Most recent vital signs: Vitals:   08/18/23 2315 08/19/23 0000  BP: 126/80   Pulse: 71   Resp: 18   Temp:  98.1  F (36.7 C)  SpO2: 100%     General: Awake, no distress.  Conversant, appears younger than chronological age, in good spirits and joking and laughing with me. CV:  Good peripheral perfusion.  Irregular rhythm, regular rate.  Normal heart sounds. Resp:  Normal effort. Speaking easily and comfortably, no accessory muscle usage nor intercostal retractions.  Clear to auscultation bilaterally.  Frequent hiccups. Abd:  No distention.  No tenderness to palpation of the abdomen including the epigastrium, no guarding.   ED Results / Procedures / Treatments   Labs (all labs ordered are listed, but only abnormal results are displayed) Labs Reviewed  BASIC METABOLIC PANEL WITH GFR - Abnormal; Notable for the following components:      Result Value   Sodium 132 (*)    CO2 20 (*)    Glucose, Bld 137 (*)    All other components within normal limits  URINALYSIS, ROUTINE W REFLEX MICROSCOPIC - Abnormal; Notable for the  following components:   Color, Urine YELLOW (*)    APPearance CLOUDY (*)    Hgb urine dipstick LARGE (*)    Protein, ur 100 (*)    All other components within normal limits  LIPASE, BLOOD - Abnormal; Notable for the following components:   Lipase 88 (*)    All other components within normal limits  CBC  TROPONIN I (HIGH SENSITIVITY)  TROPONIN I (HIGH SENSITIVITY)     EKG  ED ECG REPORT I, Loleta Rose, the attending physician, personally viewed and interpreted this ECG.  Date: 08/17/2023 EKG Time: 18: 24 Rate: 95 Rhythm: Atrial fibrillation QRS Axis: normal Intervals: normal, QTc 407 ms ST/T Wave abnormalities: Non-specific ST segment / T-wave changes, but no clear evidence of acute ischemia. Narrative Interpretation: no definitive evidence of acute ischemia; does not meet STEMI criteria.    RADIOLOGY I viewed and interpreted the patient's two-view chest x-ray and I see no evidence of consolidation or pulmonary edema.  I also read the radiologist's report, which  confirmed no acute findings.   PROCEDURES:  Critical Care performed: No  .1-3 Lead EKG Interpretation  Performed by: Loleta Rose, MD Authorized by: Loleta Rose, MD     Interpretation: abnormal     ECG rate:  70   ECG rate assessment: normal     Rhythm: atrial fibrillation     Ectopy: none     Conduction: normal       IMPRESSION / MDM / ASSESSMENT AND PLAN / ED COURSE  I reviewed the triage vital signs and the nursing notes.                              Differential diagnosis includes, but is not limited to, nonspecific hiccups, pneumonia, atypical ACS presentation, pancreatitis, metabolic or electrolyte abnormality, renal dysfunction.  Patient's presentation is most consistent with acute presentation with potential threat to life or bodily function.  Labs/studies ordered: EKG, two-view chest x-ray, BMP, high-sensitivity troponin x 2, lipase, urinalysis, CBC  Interventions/Medications given:  Medications  chlorproMAZINE (THORAZINE) tablet 25 mg (25 mg Oral Given 08/19/23 0014)  iohexol (OMNIPAQUE) 300 MG/ML solution 80 mL (80 mLs Intravenous Contrast Given 08/19/23 0033)    (Note:  hospital course my include additional interventions and/or labs/studies not listed above.)   The patient has normal vitals at this point; he is initially slightly hypertensive in triage but his last blood pressure was 126/80.  His heart rate is normal and he is chronically in atrial fibrillation.  His lab work is all reassuring with minimally decreased sodium and CO2 levels but all essentially normal.  He still has hematuria but this appears to be chronic and there is no evidence of infection.  I advised him and his wife that they can follow-up with Dr. Lonna Cobb but there is no other emergent evaluation needed.  His main issue at this point seems to be the discomfort associated with the hiccups.  After weighing various options (particular given the concern about his age and pharmacological  interventions that may cause hypotension or other complications), I decided to try Thorazine 25 mg p.o. Although this may be less likely to be successful than the IV route, I am concerned about the side effects.  I think this is a reasonable try and I may consider an outpatient course of a lower dose such as 10 mg 3 times daily to see if it helps.  I will reassess the patient after  the Thorazine tablet  The patient is on the cardiac monitor to evaluate for evidence of arrhythmia and/or significant heart rate changes.   Clinical Course as of 08/19/23 0145  Sun Aug 18, 2023  2352 Lipase(!): 88 minimally elevated lipase of unclear clinical significance [CF]  Mon Aug 19, 2023  0016 Patient without tenderness to palpation of the abdomen, but given the hiccups and the elevated lipase, as well as some questionable abdominal swelling per the patient's wife, I will evaluate with CT of the abdomen pelvis to look for suspected pancreatitis [CF]  0142 CT ABDOMEN PELVIS W CONTRAST I viewed and interpreted the patient's CT scan I see no evidence of acute abnormality.  Radiologist confirmed no evidence of pancreatitis, SBO/ileus, etc.  I reassessed the patient and his hiccups have decreased in frequency.  We thought that they stopped, but they started up again while he was talking to me.  However he is comfortable with the plan for discharge.  We talked about a prescription for Thorazine, which is not my favorite option, but since he is still having the hiccups it is worth a try.  I cautioned him about side effects and encouraged close outpatient follow-up with his PCP.  The patient's medical screening exam is reassuring with no indication of an emergent medical condition requiring hospitalization or additional evaluation at this point.  The patient is safe and appropriate for discharge and outpatient follow up. [CF]    Clinical Course User Index [CF] Loleta Rose, MD     FINAL CLINICAL IMPRESSION(S) / ED  DIAGNOSES   Final diagnoses:  Hiccups  Elevated lipase     Rx / DC Orders   ED Discharge Orders          Ordered    chlorproMAZINE (THORAZINE) 10 MG tablet  3 times daily        08/19/23 0145             Note:  This document was prepared using Dragon voice recognition software and may include unintentional dictation errors.   Loleta Rose, MD 08/19/23 [phone]

## 2023-08-19 ENCOUNTER — Emergency Department

## 2023-08-19 MED ORDER — CHLORPROMAZINE HCL 10 MG PO TABS: 10.0000 mg | ORAL_TABLET | Freq: Three times a day (TID) | ORAL | 0 refills | Status: DC

## 2023-08-19 MED ORDER — IOHEXOL 300 MG/ML  SOLN
80.0000 mL | Freq: Once | INTRAMUSCULAR | Status: AC | PRN
  Administered 2023-08-19: 80 mL via INTRAVENOUS

## 2023-08-19 NOTE — Discharge Instructions (Addendum)
 As we discussed, your evaluation was reassuring tonight.  You have a slightly elevated lipase level, which is of unclear clinical significance because your CT scan is normal and you are having no abdominal pain or nausea or vomiting.  Your hiccups improved after a dose of chlorpromazine in the emergency department so we have written you a prescription to try as an outpatient.  However, this is not a medication you should take continuously, and we recommend you follow-up with your regular doctor at the next available opportunity to discuss different or additional treatments or whether you should continue taking this medication.  You can continue taking your regular medications as well.  Return to the emergency department if you develop new or worsening symptoms that concern you.

## 2023-08-20 ENCOUNTER — Ambulatory Visit: Admitting: Physician Assistant

## 2023-08-20 ENCOUNTER — Observation Stay
Admission: EM | Admit: 2023-08-20 | Discharge: 2023-08-23 | Disposition: A | Discharge status: Home / Self Care | Attending: Internal Medicine | Admitting: Internal Medicine

## 2023-08-20 ENCOUNTER — Observation Stay

## 2023-08-20 ENCOUNTER — Other Ambulatory Visit: Payer: Self-pay

## 2023-08-20 ENCOUNTER — Emergency Department

## 2023-08-20 DIAGNOSIS — I130 Hypertensive heart and chronic kidney disease with heart failure and stage 1 through stage 4 chronic kidney disease, or unspecified chronic kidney disease: Secondary | ICD-10-CM | POA: Insufficient documentation

## 2023-08-20 DIAGNOSIS — R531 Weakness: Secondary | ICD-10-CM | POA: Insufficient documentation

## 2023-08-20 DIAGNOSIS — R4182 Altered mental status, unspecified: Secondary | ICD-10-CM | POA: Diagnosis present

## 2023-08-20 DIAGNOSIS — Z1152 Encounter for screening for COVID-19: Secondary | ICD-10-CM | POA: Diagnosis not present

## 2023-08-20 DIAGNOSIS — E871 Hypo-osmolality and hyponatremia: Secondary | ICD-10-CM | POA: Insufficient documentation

## 2023-08-20 DIAGNOSIS — M6281 Muscle weakness (generalized): Secondary | ICD-10-CM | POA: Diagnosis not present

## 2023-08-20 DIAGNOSIS — I4819 Other persistent atrial fibrillation: Secondary | ICD-10-CM

## 2023-08-20 DIAGNOSIS — R410 Disorientation, unspecified: Secondary | ICD-10-CM

## 2023-08-20 DIAGNOSIS — N390 Urinary tract infection, site not specified: Secondary | ICD-10-CM | POA: Insufficient documentation

## 2023-08-20 DIAGNOSIS — N401 Enlarged prostate with lower urinary tract symptoms: Secondary | ICD-10-CM | POA: Diagnosis not present

## 2023-08-20 DIAGNOSIS — N1831 Chronic kidney disease, stage 3a: Secondary | ICD-10-CM | POA: Diagnosis not present

## 2023-08-20 DIAGNOSIS — R2681 Unsteadiness on feet: Secondary | ICD-10-CM | POA: Insufficient documentation

## 2023-08-20 DIAGNOSIS — R8281 Pyuria: Secondary | ICD-10-CM

## 2023-08-20 DIAGNOSIS — R066 Hiccough: Secondary | ICD-10-CM | POA: Diagnosis not present

## 2023-08-20 DIAGNOSIS — Z79899 Other long term (current) drug therapy: Secondary | ICD-10-CM | POA: Insufficient documentation

## 2023-08-20 DIAGNOSIS — K5900 Constipation, unspecified: Secondary | ICD-10-CM | POA: Insufficient documentation

## 2023-08-20 DIAGNOSIS — T50905A Adverse effect of unspecified drugs, medicaments and biological substances, initial encounter: Secondary | ICD-10-CM

## 2023-08-20 DIAGNOSIS — I5032 Chronic diastolic (congestive) heart failure: Secondary | ICD-10-CM

## 2023-08-20 DIAGNOSIS — Z7901 Long term (current) use of anticoagulants: Secondary | ICD-10-CM | POA: Insufficient documentation

## 2023-08-20 DIAGNOSIS — N400 Enlarged prostate without lower urinary tract symptoms: Secondary | ICD-10-CM | POA: Insufficient documentation

## 2023-08-20 LAB — URINALYSIS, COMPLETE (UACMP) WITH MICROSCOPIC
Bacteria, UA: NONE SEEN
Bilirubin Urine: NEGATIVE
Glucose, UA: NEGATIVE mg/dL
Ketones, ur: 20 mg/dL — AB
Leukocytes,Ua: NEGATIVE
Nitrite: NEGATIVE
Protein, ur: 100 mg/dL — AB
RBC / HPF: >50 RBC/hpf (ref 0–5)
Specific Gravity, Urine: 1.023 (ref 1.005–1.030)
Squamous Epithelial / HPF: 0 /HPF (ref 0–5)
WBC, UA: >50 WBC/hpf (ref 0–5)
pH: 6 (ref 5.0–8.0)

## 2023-08-20 LAB — COMPREHENSIVE METABOLIC PANEL WITH GFR
ALT: 13 U/L (ref 0–44)
AST: 25 U/L (ref 15–41)
Albumin: 4.1 g/dL (ref 3.5–5.0)
Alkaline Phosphatase: 70 U/L (ref 38–126)
Anion gap: 9 (ref 5–15)
BUN: 19 mg/dL (ref 8–23)
CO2: 26 mmol/L (ref 22–32)
Calcium: 9.3 mg/dL (ref 8.9–10.3)
Chloride: 98 mmol/L (ref 98–111)
Creatinine, Ser: 1.2 mg/dL (ref 0.61–1.24)
GFR, Estimated: 57 mL/min — ABNORMAL LOW (ref >60)
Glucose, Bld: 108 mg/dL — ABNORMAL HIGH (ref 70–99)
Potassium: 4.5 mmol/L (ref 3.5–5.1)
Sodium: 133 mmol/L — ABNORMAL LOW (ref 135–145)
Total Bilirubin: 1.7 mg/dL — ABNORMAL HIGH (ref 0.0–1.2)
Total Protein: 7 g/dL (ref 6.5–8.1)

## 2023-08-20 LAB — CBC
HCT: 43.3 % (ref 39.0–52.0)
Hemoglobin: 15.1 g/dL (ref 13.0–17.0)
MCH: 32.1 pg (ref 26.0–34.0)
MCHC: 34.9 g/dL (ref 30.0–36.0)
MCV: 92.1 fL (ref 80.0–100.0)
Platelets: 252 10*3/uL (ref 150–400)
RBC: 4.7 MIL/uL (ref 4.22–5.81)
RDW: 12.6 % (ref 11.5–15.5)
WBC: 6.8 10*3/uL (ref 4.0–10.5)
nRBC: 0 % (ref 0.0–0.2)

## 2023-08-20 LAB — RESP PANEL BY RT-PCR (RSV, FLU A&B, COVID)  RVPGX2
Influenza A by PCR: NEGATIVE
Influenza B by PCR: NEGATIVE
Resp Syncytial Virus by PCR: NEGATIVE
SARS Coronavirus 2 by RT PCR: NEGATIVE

## 2023-08-20 LAB — TROPONIN I (HIGH SENSITIVITY): Troponin I (High Sensitivity): 8 ng/L (ref <18)

## 2023-08-20 MED ORDER — METOPROLOL SUCCINATE ER 50 MG PO TB24
50.0000 mg | ORAL_TABLET | Freq: Every evening | ORAL | Status: DC
  Administered 2023-08-21 – 2023-08-22 (×2): 50 mg via ORAL
  Filled 2023-08-20 (×3): qty 1

## 2023-08-20 MED ORDER — LORATADINE 10 MG PO TABS
10.0000 mg | ORAL_TABLET | Freq: Every day | ORAL | Status: DC
  Administered 2023-08-20 – 2023-08-23 (×4): 10 mg via ORAL
  Filled 2023-08-20 (×4): qty 1

## 2023-08-20 MED ORDER — FINASTERIDE 5 MG PO TABS
5.0000 mg | ORAL_TABLET | Freq: Every day | ORAL | Status: DC
  Administered 2023-08-21 – 2023-08-23 (×3): 5 mg via ORAL
  Filled 2023-08-20 (×3): qty 1

## 2023-08-20 MED ORDER — ACETAMINOPHEN 325 MG PO TABS: 650.0000 mg | ORAL_TABLET | Freq: Four times a day (QID) | ORAL | Status: DC | PRN

## 2023-08-20 MED ORDER — SODIUM CHLORIDE 0.9 % IV BOLUS
1000.0000 mL | Freq: Once | INTRAVENOUS | Status: AC
  Administered 2023-08-20: 1000 mL via INTRAVENOUS

## 2023-08-20 MED ORDER — AMOXICILLIN 500 MG PO CAPS
500.0000 mg | ORAL_CAPSULE | Freq: Three times a day (TID) | ORAL | Status: AC
  Administered 2023-08-21 – 2023-08-22 (×6): 500 mg via ORAL
  Filled 2023-08-20 (×6): qty 1

## 2023-08-20 MED ORDER — ACETAMINOPHEN 650 MG RE SUPP: 650.0000 mg | Freq: Four times a day (QID) | RECTAL | Status: DC | PRN

## 2023-08-20 MED ORDER — POLYETHYLENE GLYCOL 3350 17 G PO PACK: 17.0000 g | PACK | Freq: Every day | ORAL | Status: DC | PRN

## 2023-08-20 MED ORDER — ONDANSETRON HCL 4 MG PO TABS: 4.0000 mg | ORAL_TABLET | Freq: Four times a day (QID) | ORAL | Status: DC | PRN

## 2023-08-20 MED ORDER — ONDANSETRON HCL 4 MG/2ML IJ SOLN: 4.0000 mg | Freq: Four times a day (QID) | INTRAMUSCULAR | Status: DC | PRN

## 2023-08-20 MED ORDER — SODIUM CHLORIDE 0.9 % IV SOLN
1.0000 g | Freq: Once | INTRAVENOUS | Status: AC
  Administered 2023-08-20: 1 g via INTRAVENOUS
  Filled 2023-08-20: qty 10

## 2023-08-20 MED ORDER — ENOXAPARIN SODIUM 40 MG/0.4ML IJ SOSY
40.0000 mg | PREFILLED_SYRINGE | INTRAMUSCULAR | Status: DC
  Administered 2023-08-20: 40 mg via SUBCUTANEOUS
  Filled 2023-08-20: qty 0.4

## 2023-08-20 MED ORDER — APIXABAN 5 MG PO TABS
5.0000 mg | ORAL_TABLET | Freq: Two times a day (BID) | ORAL | Status: DC
Start: 2023-08-21 — End: 2023-08-23
  Administered 2023-08-21 – 2023-08-23 (×5): 5 mg via ORAL
  Filled 2023-08-20 (×5): qty 1

## 2023-08-20 MED ORDER — BRIMONIDINE TARTRATE 0.2 % OP SOLN
1.0000 [drp] | Freq: Two times a day (BID) | OPHTHALMIC | Status: DC
  Administered 2023-08-20 – 2023-08-23 (×6): 1 [drp] via OPHTHALMIC
  Filled 2023-08-20 (×2): qty 5

## 2023-08-20 MED ORDER — SODIUM CHLORIDE 0.9% FLUSH
3.0000 mL | Freq: Two times a day (BID) | INTRAVENOUS | Status: DC
  Administered 2023-08-21 – 2023-08-23 (×4): 3 mL via INTRAVENOUS

## 2023-08-20 NOTE — ED Notes (Signed)
 Completed UA sample. Sent to Lab.

## 2023-08-20 NOTE — Assessment & Plan Note (Signed)
-   Resume home regimen

## 2023-08-20 NOTE — ED Provider Notes (Signed)
 [facility] Provider Note    Event Date/Time   First MD Initiated Contact with Patient 08/20/23 1539     (approximate)  History   Chief Complaint: Altered Mental Status  HPI  Johnathan Byrd is a 89 y.o. male with a past medical history of atrial fibrillation on apixaban, CHF, CKD, presents to the emergency department altered mental status.  According to the patient's significant other he lives at a nursing facility, today he did not answer his room phone and I found the patient in the bathroom confused and had urinated on himself.  Significant other states this is very atypical.  Patient was recently seen in the emergency department for hiccups.  I reviewed the patient's workup from yesterday in the emergency department patient was given Thorazine and prescribed Thorazine to go home.  Physical Exam   Triage Vital Signs: ED Triage Vitals  Encounter Vitals Group     BP 08/20/23 1527 (!) 155/100     Systolic BP Percentile --      Diastolic BP Percentile --      Pulse Rate 08/20/23 1527 (!) 56     Resp 08/20/23 1527 11     Temp 08/20/23 1527 98 F (36.7 C)     Temp Source 08/20/23 1527 Axillary     SpO2 08/20/23 1527 100 %     Weight --      Height --      Head Circumference --      Peak Flow --      Pain Score 08/20/23 1530 5     Pain Loc --      Pain Education --      Exclude from Growth Chart --     Most recent vital signs: Vitals:   08/20/23 1605 08/20/23 1615  BP:    Pulse: 97 78  Resp: 14 19  Temp:    SpO2: 100% 98%    General: Awake, alert, confused, oriented to person and place only but not time. CV:  Good peripheral perfusion.  Regular rate and rhythm  Resp:  Normal effort.  Equal breath sounds bilaterally.  Abd:  Moderately distended abdomen although nontender to palpation.  Tympanic percussion.  ED Results / Procedures / Treatments   RADIOLOGY  I have reviewed interpret the CT abdomen images I do not see any bowel obstruction  on my evaluation. CT scan of the head is negative for acute abnormality.  MEDICATIONS ORDERED IN ED: Medications  sodium chloride 0.9 % bolus 1,000 mL (1,000 mLs Intravenous New Bag/Given 08/20/23 1555)     IMPRESSION / MDM / ASSESSMENT AND PLAN / ED COURSE  I reviewed the triage vital signs and the nursing notes.  Patient's presentation is most consistent with acute presentation with potential threat to life or bodily function.  Patient presents to the emergency department for confusion.  Patient was found in his room at [facility] fully close had urinated on himself was in the bathroom and appeared quite confused.  Patient's girlfriend who is here with him states normally he is able to drive himself, is usually alert and oriented x 4.  She states this is a major change from the patient's baseline.  I reviewed the patient's note from yesterday he was placed on Thorazine for intractable hiccups.  Thorazine could be the cause for the patient's increased confusion today although it was a lower than normal dose.  Patient's workup otherwise shows a reassuring chemistry reassuring CBC and a negative  troponin.  Respiratory panel is negative.  Urinalysis however does show greater than 50 red cells and white cells possibly indicating a urinary tract infection.  We will dose IV Rocephin and admit to the hospital service obviously to discontinue Thorazine and continue UTI treatment while awaiting culture results.  Patient and girlfriend agreeable to plan of care.  FINAL CLINICAL IMPRESSION(S) / ED DIAGNOSES   Altered mental status Confusion Medication reaction Urinary tract infection   Note:  This document was prepared using Dragon voice recognition software and may include unintentional dictation errors.   Minna Antis, MD 08/20/23 1754

## 2023-08-20 NOTE — Assessment & Plan Note (Signed)
 Patient is presenting with 1 day of altered mental status with predominantly disorientation.  During this time, he is also developed an unusual jerking of the bilateral upper extremities during which he grabs his neck but cannot tell me if he is having paresthesia.  No focal weakness.  He was started on baclofen yesterday, so this may be a drug reaction.  Interestingly, it can also cause paresthesia.  Additionally, he's developed new rash and new pyuria on urinalysis.  He endorses recent history of urinary symptoms but cannot tell me if he is having any currently.   - CT C-spine to evaluate for radiculopathy - S/p 1 L bolus given ketones on UA - Empiric treatment with Rocephin - Hold all centrally acting medications including new baclofen prescription

## 2023-08-20 NOTE — H&P (Signed)
 History and Physical    Patient: Johnathan Byrd:[id number] DOB: [birth year] DOA: 08/20/2023 DOS: the patient was seen and examined on 08/20/2023 PCP: Kandyce Rud, MD  Patient coming from: ALF/ILF  Chief Complaint:  Chief Complaint  Patient presents with   Altered Mental Status   HPI: Johnathan Byrd is a 89 y.o. male with medical history significant of BPH with LUTS, Persistent atrial fibrillation, prediabetes, chronic diastolic heart failure, CKD stage IIIa, who presents to the ED due to altered mental status.  History provided by patient's girlfriend at bedside and through chart review due to patient's altered mental status. [name] states that yesterday, patient seemed to be at his baseline.  Today, staff noted that patient was confused standing in the bathroom and had an episode of large-volume urinary incontinence. At that time, he was disoriented and unable to tell staff what occurred.  This is unusual, as Mr. Johnathan Byrd is usually oriented x 4.  [name] states that she has also noticed that he keeps grimacing and grabbing his neck.  This is a new behavior that began today.  She has also noticed a rash that is new today.  She notes that he got a dose of medication for hiccups when he was at the ER on 4/6 in the PM.  Then yesterday, his PCP started him on 2 new medications but she is unsure if he has taken them.  [name] tells me he is here today because he "made a mess."  Otherwise, he denies any complaints including chest pain, shortness of breath, dizziness, headache, he denies any specific neck pain.  ED course: On arrival to the ED, patient was hypertensive at 155/100 with heart rate of 88.  He was saturating at 100% on room air.  He was afebrile and 98.  Initial workup notable for unremarkable CBC, sodium 133, glucose 108, creat 1.20 GFR 57.  COVID-19, influenza and RSV PCR negative.  UA with large hematuria, ketonuria, and pyuria.  CT head with no acute abnormalities.  CT abdomen  pending.  Patient started on Rocephin IV fluids.  TRH contacted for admission.  Review of Systems: As mentioned in the history of present illness. All other systems reviewed and are negative.  Past Medical History:  Diagnosis Date   Atrial fibrillation (HCC)    a.) CHA2DS2-VASc = 4 (age x 2, CHF, HTN). b.) rate/rhythm maintained on oral metoprolol succinate; chronically anticoagulated with apixaban.   B12 deficiency    BPH associated with nocturia    BPPV (benign paroxysmal positional vertigo)    CHF (congestive heart failure) (HCC)    CKD (chronic kidney disease), stage III (HCC)    Glaucoma    Incomplete right bundle branch block (RBBB)    Long term current use of anticoagulant    a.) apixaban   Macular degeneration    Past Surgical History:  Procedure Laterality Date   COLONOSCOPY W/ POLYPECTOMY     GREEN LIGHT LASER TURP (TRANSURETHRAL RESECTION OF PROSTATE N/A 10/19/2021   Procedure: GREEN LIGHT LASER TURP (TRANSURETHRAL RESECTION OF PROSTATE;  Surgeon: Orson Ape, MD;  Location: [facility] ORS;  Service: Urology;  Laterality: N/A;   Social History:  reports that he has never smoked. He has never used smokeless tobacco. He reports that he does not drink alcohol and does not use drugs.  Allergies  Allergen Reactions   Other Other (See Comments)    Unknown  Unknown   Sulfa Antibiotics     Family History  Problem Relation  Age of Onset   Alzheimer's disease Mother    Stroke Father     Prior to Admission medications   Medication Sig Start Date End Date Taking? Authorizing Provider  amoxicillin (AMOXIL) 875 MG tablet Take 1 tablet (875 mg total) by mouth 2 (two) times daily for 7 days. 08/15/23 08/22/23  Stoioff, Verna Czech, MD  brimonidine (ALPHAGAN) 0.2 % ophthalmic solution 1 drop two (2) times a day.    [provider]  chlorproMAZINE (THORAZINE) 10 MG tablet Take 1 tablet (10 mg total) by mouth 3 (three) times daily for 7 days. 08/19/23 08/26/23  Loleta Rose, MD   Cyanocobalamin (VITAMIN B-12) 2500 MCG SUBL Place under the tongue.    [provider]  docusate sodium (COLACE) 100 MG capsule Take 2 capsules (200 mg total) by mouth 2 (two) times daily. 10/19/21   Orson Ape, MD  ELIQUIS 5 MG TABS tablet TAKE 1 TABLET(5 MG) BY MOUTH TWICE DAILY 08/01/23   Antonieta Iba, MD  finasteride (PROSCAR) 5 MG tablet Take 1 tablet (5 mg total) by mouth daily. 07/26/23   Stoioff, Verna Czech, MD  metoprolol succinate (TOPROL-XL) 50 MG 24 hr tablet TAKE 1 TABLET(50 MG) BY MOUTH EVERY EVENING 06/21/23   Gollan, Tollie Pizza, MD  Zinc Picolinate POWD Take by mouth.    [provider]    Physical Exam: Vitals:   08/20/23 1800 08/20/23 1900 08/20/23 1945 08/20/23 1946  BP: 129/85 (!) 165/88 (!) 158/89   Pulse: 91 86 91   Resp: 13 (!) 22 (!) 25   Temp:    98.4 F (36.9 C)  TempSrc:    Oral  SpO2: 100% 100% 100%    Physical Exam Vitals and nursing note reviewed.  Constitutional:      General: He is not in acute distress.    Appearance: He is obese.  HENT:     Head: Normocephalic and atraumatic.     Mouth/Throat:     Mouth: Mucous membranes are moist.     Pharynx: Oropharynx is clear.  Eyes:     Conjunctiva/sclera: Conjunctivae normal.     Pupils: Pupils are equal, round, and reactive to light.  Neck:     Comments: No spinal tenderness on palpation.  During examination and discussion, patient frequently grimaces with jerking of bilateral upper extremities.  He then grabs his left neck. Cardiovascular:     Rate and Rhythm: Normal rate. Rhythm irregular.     Heart sounds: No murmur heard. Pulmonary:     Effort: Pulmonary effort is normal. No respiratory distress.     Breath sounds: Normal breath sounds. No wheezing, rhonchi or rales.  Abdominal:     General: Bowel sounds are normal. There is distension.     Palpations: Abdomen is soft.     Tenderness: There is no abdominal tenderness. There is no guarding.  Musculoskeletal:     Right lower  leg: No edema.     Left lower leg: No edema.  Skin:    General: Skin is warm and dry.     Comments: Pinpoint scattered rash on the bilateral lower extremities. Rash on the right lateral abdomen and back. Not consistent with urticaria or petechiae. No consistent with cellulitis.   Neurological:     Mental Status: He is alert.     Comments:  Oriented to person, place, month but not situation. Difficulty giving recent history.  Bilateral jerking of the upper extremities, intermittent Difficulty following instructions  No tremors at rest  No focal weakness of the upper or lower extremities.  No facial asymmetry or dysarthria.       Data Reviewed: CBC with WBC of 6.8, hemoglobin of 15.1, platelets of 252 CMP with sodium of 133, potassium 4.5, bicarb 26, glucose 108, BUN 19, creatinine 1.19, AST 25, ALT 13, GFR 57 Troponin 8 COVID-19, influenza and RSV PCR negative Urinalysis with large hematuria, ketonuria, proteinuria, elevated RBC and WBC per hpf  CT ABDOMEN PELVIS WO CONTRAST Result Date: 08/20/2023 CLINICAL DATA:  Altered mental status this morning. Found in the bathroom with his clothes on and had urinated on himself. Neck pain. EXAM: CT ABDOMEN AND PELVIS WITHOUT CONTRAST TECHNIQUE: Multidetector CT imaging of the abdomen and pelvis was performed following the standard protocol without IV contrast. RADIATION DOSE REDUCTION: This exam was performed according to the departmental dose-optimization program which includes automated exposure control, adjustment of the mA and/or kV according to patient size and/or use of iterative reconstruction technique. COMPARISON:  08/19/2023 FINDINGS: Lower chest: Enlarged heart. Mild aortic and coronary artery calcifications. Minimal bibasilar atelectasis. Hepatobiliary: Interval sludge in the gallbladder. Small liver containing 2 previously demonstrated small cysts, not needing imaging follow-up. Pancreas: Unremarkable. No pancreatic ductal dilatation or  surrounding inflammatory changes. Spleen: Normal in size without focal abnormality. Adrenals/Urinary Tract: Previously demonstrated bilateral simple appearing renal cysts not needing imaging follow-up. Unremarkable adrenal glands and ureters. Poorly distended urinary bladder containing interval medium density fluid measuring 54 Hounsfield units in density. Stomach/Bowel: Stomach is within normal limits. Appendix appears normal. No evidence of bowel wall thickening, distention, or inflammatory changes. Vascular/Lymphatic: Atheromatous arterial calcifications without aneurysm. No enlarged lymph nodes. Reproductive: Markedly enlarged prostate gland. Moderate-sized bilateral hydroceles. Probable left scrotal varicocele. Other: Small right inguinal hernia containing fat, small to moderate-sized left inguinal hernia containing fat and tiny umbilical hernia containing fat. Musculoskeletal: Mild bilateral hip degenerative changes. Moderate lumbar and lower thoracic spine degenerative changes with changes of DISH. IMPRESSION: 1. Interval medium density fluid in the urinary bladder, consistent with hemorrhage. 2. Interval sludge in the gallbladder. 3. Markedly enlarged prostate gland. 4. Moderate-sized bilateral hydroceles. 5. Probable left scrotal varicocele. 6. Cardiomegaly. 7. Mild calcific coronary artery and aortic atherosclerosis. Electronically Signed   By: Beckie Salts M.D.   On: 08/20/2023 18:41   CT HEAD WO CONTRAST ( ) Result Date: 08/20/2023 CLINICAL DATA:  89 year old male with altered mental status, found in bathroom. EXAM: CT HEAD WITHOUT CONTRAST TECHNIQUE: Contiguous axial images were obtained from the base of the skull through the vertex without intravenous contrast. RADIATION DOSE REDUCTION: This exam was performed according to the departmental dose-optimization program which includes automated exposure control, adjustment of the mA and/or kV according to patient size and/or use of iterative  reconstruction technique. COMPARISON:  Brain MRI 07/29/2020. FINDINGS: Brain: Stable cerebral volume. No midline shift, ventriculomegaly, mass effect, evidence of mass lesion, intracranial hemorrhage or evidence of cortically based acute infarction. Patchy and confluent bilateral cerebral white matter hypodensity appears stable to FLAIR signal changes on previous MRI. Vascular: Calcified atherosclerosis at the skull base. No suspicious intracranial vascular hyperdensity. Skull: Intact.  No acute osseous abnormality identified. Sinuses/Orbits: Visualized paranasal sinuses and mastoids are stable and well aerated. Other: No acute orbit or scalp soft tissue finding. IMPRESSION: 1. No acute intracranial abnormality. 2. Stable appearance of the brain compared to 2022 MRI. Electronically Signed   By: Odessa Fleming M.D.   On: 08/20/2023 16:58   Results are pending, will review when available.  Assessment and Plan:  *  Altered mental status Patient is presenting with 1 day of altered mental status with predominantly disorientation.  During this time, he is also developed an unusual jerking of the bilateral upper extremities during which he grabs his neck but cannot tell me if he is having paresthesia.  No focal weakness.  He was started on baclofen yesterday, so this may be a drug reaction.  Interestingly, it can also cause paresthesia.  Additionally, he's developed new rash and new pyuria on urinalysis.  He endorses recent history of urinary symptoms but cannot tell me if he is having any currently.   - CT C-spine to evaluate for radiculopathy - S/p 1 L bolus given ketones on UA - Empiric treatment with Rocephin - Hold all centrally acting medications including new baclofen prescription  Pyuria Patient was recently seen by urology at which time he was endorsing dysuria.  Urinalysis did not show bacteria or pyuria but culture grew strep mitis.  He was switched to amoxicillin approximately 5 days ago and is still on  this at this time.  - S/p Rocephin 1 g once - Resume home amoxicillin tomorrow - Urine culture pending  Chronic diastolic heart failure (HCC) Per chart review, patient has a history of HFpEF with last EF of 65%.  He is not on chronic diuretics. Patient appears euvolemic on examination at this time.  - Daily weights  BPH (benign prostatic hyperplasia) - Resume home regimen  Persistent atrial fibrillation (HCC) - Resume home regimen  Advance Care Planning:   Code Status: Limited: Do not attempt resuscitation (DNR) -DNR-LIMITED -Do Not Intubate/DNI confirmed by patient's MOST form at bedside  Consults: None  Family Communication: Patient's girlfriend updated at bedside  Severity of Illness: The appropriate patient status for this patient is OBSERVATION. Observation status is judged to be reasonable and necessary in order to provide the required intensity of service to ensure the patient's safety. The patient's presenting symptoms, physical exam findings, and initial radiographic and laboratory data in the context of their medical condition is felt to place them at decreased risk for further clinical deterioration. Furthermore, it is anticipated that the patient will be medically stable for discharge from the hospital within 2 midnights of admission.   Author: Verdene Lennert, MD 08/20/2023 8:01 PM  For on call review [email or web address].

## 2023-08-20 NOTE — ED Triage Notes (Signed)
 Coming from [facility]. Patient is baseline walking and AOX4. Patient did not answer wake up call this morning per staff which is not his normal. LKW: 0830 08/20/2023. Patient was found in bathroom with all clothes on and urinated on himself. Patient was Altered this morning and for EMS on seen. Patient was recently in ED for "hiccups". Hx A-fib. Unsure if he took medications this morning. CBG: 104 for EMS.  Patient AOX2. Patient states his birth year is [birth year]. He can tell RN the year is 2025. Patient complaining of pain in neck. Patient was negative stroke screen for EMS.

## 2023-08-20 NOTE — ED Notes (Signed)
 TRANSPORT paged @ this time to assist in transferring pt to assigned in-patient room

## 2023-08-20 NOTE — Assessment & Plan Note (Signed)
 Patient was recently seen by urology at which time he was endorsing dysuria.  Urinalysis did not show bacteria or pyuria but culture grew strep mitis.  He was switched to amoxicillin approximately 5 days ago and is still on this at this time.  - S/p Rocephin 1 g once - Resume home amoxicillin tomorrow - Urine culture pending

## 2023-08-20 NOTE — Assessment & Plan Note (Addendum)
 Per chart review, patient has a history of HFpEF with last EF of 65%.  He is not on chronic diuretics. Patient appears euvolemic on examination at this time.  - Daily weights

## 2023-08-20 NOTE — ED Notes (Signed)
 Patient now able to tell RN his birthday and year. Patient able to tell RN what year we are currently in 2025. Patient able to tell RN where he is at [facility].

## 2023-08-20 NOTE — ED Notes (Signed)
 Pt transported to inpatient floor at this time with all belongings at the bedside. Central telemetry made aware of patients move.

## 2023-08-21 DIAGNOSIS — R410 Disorientation, unspecified: Secondary | ICD-10-CM | POA: Diagnosis not present

## 2023-08-21 LAB — COMPREHENSIVE METABOLIC PANEL WITH GFR
ALT: 15 U/L (ref 0–44)
AST: 16 U/L (ref 15–41)
Albumin: 3.3 g/dL — ABNORMAL LOW (ref 3.5–5.0)
Alkaline Phosphatase: 56 U/L (ref 38–126)
Anion gap: 9 (ref 5–15)
BUN: 16 mg/dL (ref 8–23)
CO2: 20 mmol/L — ABNORMAL LOW (ref 22–32)
Calcium: 8.8 mg/dL — ABNORMAL LOW (ref 8.9–10.3)
Chloride: 102 mmol/L (ref 98–111)
Creatinine, Ser: 0.94 mg/dL (ref 0.61–1.24)
GFR, Estimated: >60 mL/min (ref >60)
Glucose, Bld: 90 mg/dL (ref 70–99)
Potassium: 3.9 mmol/L (ref 3.5–5.1)
Sodium: 131 mmol/L — ABNORMAL LOW (ref 135–145)
Total Bilirubin: 1.3 mg/dL — ABNORMAL HIGH (ref 0.0–1.2)
Total Protein: 5.5 g/dL — ABNORMAL LOW (ref 6.5–8.1)

## 2023-08-21 LAB — CBC
HCT: 38.6 % — ABNORMAL LOW (ref 39.0–52.0)
Hemoglobin: 13.6 g/dL (ref 13.0–17.0)
MCH: 32.1 pg (ref 26.0–34.0)
MCHC: 35.2 g/dL (ref 30.0–36.0)
MCV: 91 fL (ref 80.0–100.0)
Platelets: 223 10*3/uL (ref 150–400)
RBC: 4.24 MIL/uL (ref 4.22–5.81)
RDW: 12.7 % (ref 11.5–15.5)
WBC: 6.1 10*3/uL (ref 4.0–10.5)
nRBC: 0 % (ref 0.0–0.2)

## 2023-08-21 LAB — URINE CULTURE: Culture: NO GROWTH

## 2023-08-21 LAB — LIPASE, BLOOD: Lipase: 31 U/L (ref 11–51)

## 2023-08-21 MED ORDER — BISACODYL 5 MG PO TBEC: 5.0000 mg | DELAYED_RELEASE_TABLET | Freq: Every day | ORAL | Status: DC | PRN

## 2023-08-21 MED ORDER — TAMSULOSIN HCL 0.4 MG PO CAPS
0.4000 mg | ORAL_CAPSULE | Freq: Every day | ORAL | Status: DC
  Administered 2023-08-21 – 2023-08-22 (×2): 0.4 mg via ORAL
  Filled 2023-08-21 (×2): qty 1

## 2023-08-21 MED ORDER — SODIUM CHLORIDE 0.9 % IV SOLN: INTRAVENOUS | Status: DC

## 2023-08-21 MED ORDER — POLYETHYLENE GLYCOL 3350 17 G PO PACK
17.0000 g | PACK | Freq: Every day | ORAL | Status: DC
  Administered 2023-08-21 – 2023-08-22 (×2): 17 g via ORAL
  Filled 2023-08-21 (×2): qty 1

## 2023-08-21 NOTE — Progress Notes (Addendum)
 Progress Note    Johnathan Byrd  YQM:[id number] DOB: June 07, 1930  DOA: 08/20/2023 PCP: Kandyce Rud, MD      Brief Narrative:    Medical records reviewed and are as summarized below:  Johnathan Byrd is a 89 y.o. male  with medical history significant of BPH with LUTS, elevated PSA, persistent atrial fibrillation, prediabetes, chronic diastolic heart failure, CKD stage IIIa, BPPV, glaucoma, vitamin B12 deficiency, who presented to the hospital because of altered mental status.  Patient cannot provide any history on presentation because of confusion.  According to [facility], his girlfriend, patient was noted to be confused while standing in the bathroom when he had an episode of urinary incontinence.  He was disoriented and could not tell the staff what had occurred.  At baseline, he is normally oriented x 4.  He has some mild unsteady gait at baseline but he is usually able to ambulate.  He saw Dr. Lonna Cobb, urologist, for dysuria on 08/09/2023 and was prescribed cefadroxil for 7 days.  It appears he was switched to amoxicillin. Of note, he got medication for hiccups when he was in the ER on 08/18/2023 (chart review showed this was Thorazine.).   His PCP started him on 2 medications recently but she was unsure whether patient has started these medicines.  Chart review shows that he saw his PCP on 08/19/2023 and he was prescribed baclofen for hiccups.     Assessment/Plan:   Principal Problem:   Altered mental status Active Problems:   Pyuria   Persistent atrial fibrillation (HCC)   BPH (benign prostatic hyperplasia)   Chronic diastolic heart failure (HCC)   Body mass index is 27.55 kg/m.   Altered mental status: Etiology unclear.  May be due to metabolic encephalopathy from hyponatremia versus toxic encephalopathy from baclofen.  Clinically undetermined at this time. Mental status is improving although he is not back to baseline according to his girlfriend..   Hyponatremia:  Restart IV fluids (normal saline at 75 mL/h).  S/p treatment with 1 L of normal saline on 08/20/2023.  Monitor BMP.   Constiption /abdominal distension: Patient and his girlfriend said that patient usually has constipation and has to use laxatives regularly.  He said MiraLAX and Dulcolax tablets usually work for him.  These medicines have been ordered.   General weakness: PT and OT evaluation   Persistent atrial fibrillation: Continue Eliquis and metoprolol   Recent UTI: Continue amoxicillin Intermittent gross hematuria, BPH, varicocele and hydrocele on CT pelvis, elevated PSA (53.54) on 08/08/2023: Dr. Lonna Cobb, urologist, had recommended that he restart Flomax.  Continue finasteride. Outpatient follow-up with Dr. Lonna Cobb, urologist.   Intermittent hiccups: He was recently prescribed baclofen by PCP on 08/19/2023.  Baclofen has been held.  He was also prescribed Thorazine by ED physician on 08/2023.   Comorbidities include chronic diastolic CHF, CKD stage IIIa   Diet Order             Diet Heart Fluid consistency: Thin  Diet effective now                            Consultants: None  Procedures: None    Medications:    amoxicillin  500 mg Oral TID   apixaban  5 mg Oral BID   brimonidine  1 drop Right Eye BID   finasteride  5 mg Oral Daily   loratadine  10 mg Oral Daily   metoprolol succinate  50  mg Oral QPM   polyethylene glycol  17 g Oral Daily   sodium chloride flush  3 mL Intravenous Q12H   tamsulosin  0.4 mg Oral QPC supper   Continuous Infusions:   Anti-infectives (From admission, onward)    Start     Dose/Rate Route Frequency Ordered Stop   08/21/23 1000  amoxicillin (AMOXIL) capsule 500 mg        500 mg Oral 3 times daily 08/20/23 2003 08/28/23 0959   08/20/23 1730  cefTRIAXone (ROCEPHIN) 1 g in sodium chloride 0.9 % 100 mL IVPB        1 g 200 mL/hr over 30 Minutes Intravenous  Once 08/20/23 1716 08/20/23 1920              Family  Communication/Anticipated D/C date and plan/Code Status   DVT prophylaxis:  apixaban (ELIQUIS) tablet 5 mg     Code Status: Limited: Do not attempt resuscitation (DNR) -DNR-LIMITED -Do Not Intubate/DNI   Family Communication: Plan discussed with [name], girlfriend at the bedside Disposition Plan: Plan to discharge to [facility] facility   Status is: Observation The patient will require care spanning > 2 midnights and should be moved to inpatient because: General weakness, confusion       Subjective:   Interval events noted.  He complains of constipation.  [name], girlfriend, was at the bedside, and she is concerned that patient's abdomen is more distended than usual.  She acknowledged that patient's mental status is better and she said "he is making a litte more sense".  Objective:    Vitals:   08/21/23 0359 08/21/23 0405 08/21/23 0530 08/21/23 0736  BP: 134/89   118/79  Pulse: 85   74  Resp: 18   18  Temp: 98.5 F (36.9 C)   98.4 F (36.9 C)  TempSrc: Oral   Oral  SpO2: 97%   93%  Weight:  79.8 kg 79.8 kg   Height:   5' 7.01" (1.702 m)    No data found.   Intake/Output Summary (Last 24 hours) at 08/21/2023 1305 Last data filed at 08/21/2023 1045 Gross per 24 hour  Intake 460 ml  Output 50 ml  Net 410 ml   Filed Weights   08/21/23 0405 08/21/23 0530  Weight: 79.8 kg 79.8 kg    Exam:  GEN: NAD SKIN: Warm and dry EYES: No pallor or icterus ENT: MMM CV: RRR PULM: CTA B ABD: soft, distended, NT, +BS CNS: AAO x 2 (person and place), non focal EXT: No edema or tenderness       Data Reviewed:   I have personally reviewed following labs and imaging studies:  Labs: Labs show the following:   Basic Metabolic Panel: Recent Labs  Lab 08/18/23 1824 08/20/23 1543 08/21/23 0349  NA 132* 133* 131*  K 4.2 4.5 3.9  CL 103 98 102  CO2 20* 26 20*  GLUCOSE 137* 108* 90  BUN 20 19 16   CREATININE 1.06 1.20 0.94  CALCIUM 9.0 9.3 8.8*   GFR Estimated  Creatinine Clearance: 52.9 mL/min (by C-G formula based on SCr of 0.94 mg/dL). Liver Function Tests: Recent Labs  Lab 08/20/23 1543 08/21/23 0349  AST 25 16  ALT 13 15  ALKPHOS 70 56  BILITOT 1.7* 1.3*  PROT 7.0 5.5*  ALBUMIN 4.1 3.3*   Recent Labs  Lab 08/18/23 2259 08/20/23 1543  LIPASE 88* 31   No results for input(s): "AMMONIA" in the last 168 hours. Coagulation profile No results for  input(s): "INR", "PROTIME" in the last 168 hours.  CBC: Recent Labs  Lab 08/18/23 1824 08/20/23 1543 08/21/23 0349  WBC 6.9 6.8 6.1  HGB 14.2 15.1 13.6  HCT 41.6 43.3 38.6*  MCV 93.7 92.1 91.0  PLT 219 252 223   Cardiac Enzymes: No results for input(s): "CKTOTAL", "CKMB", "CKMBINDEX", "TROPONINI" in the last 168 hours. BNP (last 3 results) No results for input(s): "PROBNP" in the last 8760 hours. CBG: No results for input(s): "GLUCAP" in the last 168 hours. D-Dimer: No results for input(s): "DDIMER" in the last 72 hours. Hgb A1c: No results for input(s): "HGBA1C" in the last 72 hours. Lipid Profile: No results for input(s): "CHOL", "HDL", "LDLCALC", "TRIG", "CHOLHDL", "LDLDIRECT" in the last 72 hours. Thyroid function studies: No results for input(s): "TSH", "T4TOTAL", "T3FREE", "THYROIDAB" in the last 72 hours.  Invalid input(s): "FREET3" Anemia work up: No results for input(s): "VITAMINB12", "FOLATE", "FERRITIN", "TIBC", "IRON", "RETICCTPCT" in the last 72 hours. Sepsis Labs: Recent Labs  Lab 08/18/23 1824 08/20/23 1543 08/21/23 0349  WBC 6.9 6.8 6.1    Microbiology Recent Results (from the past 240 hours)  Resp panel by RT-PCR (RSV, Flu A&B, Covid) Anterior Nasal Swab     Status: None   Collection Time: 08/20/23  3:54 PM   Specimen: Anterior Nasal Swab  Result Value Ref Range Status   SARS Coronavirus 2 by RT PCR NEGATIVE NEGATIVE Final    Comment: (NOTE) SARS-CoV-2 target nucleic acids are NOT DETECTED.  The SARS-CoV-2 RNA is generally detectable in upper  respiratory specimens during the acute phase of infection. The lowest concentration of SARS-CoV-2 viral copies this assay can detect is 138 copies/mL. A negative result does not preclude SARS-Cov-2 infection and should not be used as the sole basis for treatment or other patient management decisions. A negative result may occur with  improper specimen collection/handling, submission of specimen other than nasopharyngeal swab, presence of viral mutation(s) within the areas targeted by this assay, and inadequate number of viral copies(<138 copies/mL). A negative result must be combined with clinical observations, patient history, and epidemiological information. The expected result is Negative.  Fact Sheet for Patients:  BloggerCourse.com  Fact Sheet for Healthcare Providers:  SeriousBroker.it  This test is no t yet approved or cleared by the [place] FDA and  has been authorized for detection and/or diagnosis of SARS-CoV-2 by FDA under an Emergency Use Authorization (EUA). This EUA will remain  in effect (meaning this test can be used) for the duration of the COVID-19 declaration under Section 564(b)(1) of the Act, 21 U.S.C.section 360bbb-3(b)(1), unless the authorization is terminated  or revoked sooner.       Influenza A by PCR NEGATIVE NEGATIVE Final   Influenza B by PCR NEGATIVE NEGATIVE Final    Comment: (NOTE) The Xpert Xpress SARS-CoV-2/FLU/RSV plus assay is intended as an aid in the diagnosis of influenza from Nasopharyngeal swab specimens and should not be used as a sole basis for treatment. Nasal washings and aspirates are unacceptable for Xpert Xpress SARS-CoV-2/FLU/RSV testing.  Fact Sheet for Patients: BloggerCourse.com  Fact Sheet for Healthcare Providers: SeriousBroker.it  This test is not yet approved or cleared by the [place] FDA and has been  authorized for detection and/or diagnosis of SARS-CoV-2 by FDA under an Emergency Use Authorization (EUA). This EUA will remain in effect (meaning this test can be used) for the duration of the COVID-19 declaration under Section 564(b)(1) of the Act, 21 U.S.C. section 360bbb-3(b)(1), unless the authorization is terminated  or revoked.     Resp Syncytial Virus by PCR NEGATIVE NEGATIVE Final    Comment: (NOTE) Fact Sheet for Patients: BloggerCourse.com  Fact Sheet for Healthcare Providers: SeriousBroker.it  This test is not yet approved or cleared by the [place] FDA and has been authorized for detection and/or diagnosis of SARS-CoV-2 by FDA under an Emergency Use Authorization (EUA). This EUA will remain in effect (meaning this test can be used) for the duration of the COVID-19 declaration under Section 564(b)(1) of the Act, 21 U.S.C. section 360bbb-3(b)(1), unless the authorization is terminated or revoked.  Performed at [facility], [address] [place], [place] [address]     Procedures and diagnostic studies:  CT CERVICAL SPINE WO CONTRAST Result Date: 08/20/2023 CLINICAL DATA:  Neck pain EXAM: CT CERVICAL SPINE WITHOUT CONTRAST TECHNIQUE: Multidetector CT imaging of the cervical spine was performed without intravenous contrast. Multiplanar CT image reconstructions were also generated. RADIATION DOSE REDUCTION: This exam was performed according to the departmental dose-optimization program which includes automated exposure control, adjustment of the mA and/or kV according to patient size and/or use of iterative reconstruction technique. COMPARISON:  None Available. FINDINGS: Alignment: No subluxation.  Facet alignment within normal limits Skull base and vertebrae: No acute fracture. No primary bone lesion or focal pathologic process. Soft tissues and spinal canal: No prevertebral fluid or swelling. No visible canal  hematoma. Disc levels: Moderate severe multilevel degenerative change with diffuse disc space narrowing and degenerative osteophyte. Possible mild ossification of posterior longitudinal low ligament at C5-C6. Multilevel facet degenerative changes with foraminal narrowing, worst at C4-C5 and C5-C6. Upper chest: Negative. Other: None IMPRESSION: 1. Negative for acute osseous abnormality. 2. Moderate to severe multilevel degenerative change. Electronically Signed   By: Jasmine Pang M.D.   On: 08/20/2023 21:20   CT ABDOMEN PELVIS WO CONTRAST Result Date: 08/20/2023 CLINICAL DATA:  Altered mental status this morning. Found in the bathroom with his clothes on and had urinated on himself. Neck pain. EXAM: CT ABDOMEN AND PELVIS WITHOUT CONTRAST TECHNIQUE: Multidetector CT imaging of the abdomen and pelvis was performed following the standard protocol without IV contrast. RADIATION DOSE REDUCTION: This exam was performed according to the departmental dose-optimization program which includes automated exposure control, adjustment of the mA and/or kV according to patient size and/or use of iterative reconstruction technique. COMPARISON:  08/19/2023 FINDINGS: Lower chest: Enlarged heart. Mild aortic and coronary artery calcifications. Minimal bibasilar atelectasis. Hepatobiliary: Interval sludge in the gallbladder. Small liver containing 2 previously demonstrated small cysts, not needing imaging follow-up. Pancreas: Unremarkable. No pancreatic ductal dilatation or surrounding inflammatory changes. Spleen: Normal in size without focal abnormality. Adrenals/Urinary Tract: Previously demonstrated bilateral simple appearing renal cysts not needing imaging follow-up. Unremarkable adrenal glands and ureters. Poorly distended urinary bladder containing interval medium density fluid measuring 54 Hounsfield units in density. Stomach/Bowel: Stomach is within normal limits. Appendix appears normal. No evidence of bowel wall thickening,  distention, or inflammatory changes. Vascular/Lymphatic: Atheromatous arterial calcifications without aneurysm. No enlarged lymph nodes. Reproductive: Markedly enlarged prostate gland. Moderate-sized bilateral hydroceles. Probable left scrotal varicocele. Other: Small right inguinal hernia containing fat, small to moderate-sized left inguinal hernia containing fat and tiny umbilical hernia containing fat. Musculoskeletal: Mild bilateral hip degenerative changes. Moderate lumbar and lower thoracic spine degenerative changes with changes of DISH. IMPRESSION: 1. Interval medium density fluid in the urinary bladder, consistent with hemorrhage. 2. Interval sludge in the gallbladder. 3. Markedly enlarged prostate gland. 4. Moderate-sized bilateral hydroceles. 5. Probable left scrotal varicocele. 6.  Cardiomegaly. 7. Mild calcific coronary artery and aortic atherosclerosis. Electronically Signed   By: Beckie Salts M.D.   On: 08/20/2023 18:41   CT HEAD WO CONTRAST ( ) Result Date: 08/20/2023 CLINICAL DATA:  89 year old male with altered mental status, found in bathroom. EXAM: CT HEAD WITHOUT CONTRAST TECHNIQUE: Contiguous axial images were obtained from the base of the skull through the vertex without intravenous contrast. RADIATION DOSE REDUCTION: This exam was performed according to the departmental dose-optimization program which includes automated exposure control, adjustment of the mA and/or kV according to patient size and/or use of iterative reconstruction technique. COMPARISON:  Brain MRI 07/29/2020. FINDINGS: Brain: Stable cerebral volume. No midline shift, ventriculomegaly, mass effect, evidence of mass lesion, intracranial hemorrhage or evidence of cortically based acute infarction. Patchy and confluent bilateral cerebral white matter hypodensity appears stable to FLAIR signal changes on previous MRI. Vascular: Calcified atherosclerosis at the skull base. No suspicious intracranial vascular hyperdensity.  Skull: Intact.  No acute osseous abnormality identified. Sinuses/Orbits: Visualized paranasal sinuses and mastoids are stable and well aerated. Other: No acute orbit or scalp soft tissue finding. IMPRESSION: 1. No acute intracranial abnormality. 2. Stable appearance of the brain compared to 2022 MRI. Electronically Signed   By: Odessa Fleming M.D.   On: 08/20/2023 16:58               LOS: 0 days   Korin Hartwell  [facility]   Pager on [email or web address]. If 7PM-7AM, please contact night-coverage at [email or web address]     08/21/2023, 1:05 PM

## 2023-08-21 NOTE — Plan of Care (Signed)

## 2023-08-21 NOTE — Care Management Obs Status (Signed)
 MEDICARE OBSERVATION STATUS NOTIFICATION   Patient Details  Name: Johnathan Byrd MRN: [id number] Date of Birth: 1930/09/13   Medicare Observation Status Notification Given:  Yes    Marcell Anger 08/21/2023, 3:57 PM

## 2023-08-21 NOTE — Progress Notes (Signed)
 Patient alert to self and place,no acute distress.Unable to complete admission questions as pt is confused.Safety maintained

## 2023-08-21 NOTE — TOC Initial Note (Signed)
 Transition of Care [facility]) - Initial/Assessment Note    Patient Details  Name: Johnathan Byrd MRN: [id number] Date of Birth: 1931-03-12  [facility] CM/SW Contact:    Chapman Fitch, RN Phone Number: 08/21/2023, 9:40 AM  Clinical Narrative:                  Per Sue Lush at [facility] patient lives at their Independent Living.  MD notified that PT OT eval will be needed when medically appropriate        Patient Goals and CMS Choice            Expected Discharge Plan and Services                                              Prior Living Arrangements/Services                       Activities of Daily Living   ADL Screening (condition at time of admission) Independently performs ADLs?: No (uta) Does the patient have a NEW difficulty with bathing/dressing/toileting/self-feeding that is expected to last >3 days?: Yes (Initiates electronic notice to provider for possible OT consult) Does the patient have a NEW difficulty with getting in/out of bed, walking, or climbing stairs that is expected to last >3 days?: Yes (Initiates electronic notice to provider for possible PT consult) Does the patient have a NEW difficulty with communication that is expected to last >3 days?: No Is the patient deaf or have difficulty hearing?: No Does the patient have difficulty seeing, even when wearing glasses/contacts?: No Does the patient have difficulty concentrating, remembering, or making decisions?: Yes  Permission Sought/Granted                  Emotional Assessment              Admission diagnosis:  Lower urinary tract infectious disease [N39.0] Altered mental status [R41.82] Altered mental status, unspecified altered mental status type [R41.82] Adverse effect of drug, initial encounter [T50.905A] Patient Active Problem List   Diagnosis Date Noted   Altered mental status 08/20/2023   Pyuria 08/20/2023   Prediabetes 11/02/2021    Persistent atrial fibrillation (HCC) 04/21/2019   BPH associated with nocturia 04/21/2019   Macular degeneration of right eye 04/21/2019   Chronic diastolic heart failure (HCC) 10/07/2015   Headache 10/07/2015   Acute decompensated heart failure (HCC) 10/02/2015   BPH (benign prostatic hyperplasia) 10/02/2015   PCP:  Kandyce Rud, MD Pharmacy:   [id number] [place], [place] - [address] AT [facility] [address] [facility] [address] [place] [address] Phone: [phone] Fax: [phone]     Social Drivers of Health (SDOH) Social History: SDOH Screenings   Food Insecurity: Patient Unable To Answer (08/21/2023)  Housing: Patient Unable To Answer (08/21/2023)  Transportation Needs: Patient Unable To Answer (08/21/2023)  Utilities: Patient Unable To Answer (08/21/2023)  Financial Resource Strain: Low Risk  (06/25/2023)   Received from [facility]  Social Connections: Patient Unable To Answer (08/21/2023)  Tobacco Use: Low Risk  (08/20/2023)   SDOH Interventions:     Readmission Risk Interventions     No data to display

## 2023-08-21 NOTE — Evaluation (Signed)
 Physical Therapy Evaluation Patient Details Name: JULIE PAOLINI MRN: [id number] DOB: [birth year] Today's Date: 08/21/2023  History of Present Illness  89 y/o male presented to ED on 08/20/23 for AMS. Admitted for hyponatremia and AMS with unclear etiology. PMH: BPH, Afib, prediabetes, CHF, CKD stage IIIa  Clinical Impression  Patient admitted with the above. PTA, patient lives alone at [facility] and was ambulatory with no AD. Will have assistance from family for first 4-5 days at discharge. Girlfriend present and reports she feels he is not back to baseline cognitively yet. A&Ox4 during session. Required supervision for bed mobility and sit to stand. Ambulated with HHAx1 200' and CGA-supervision. Unable to ambulate with no AD this session due to feeling uncomfortable with balance. Patient will benefit from skilled PT services during acute stay to address listed deficits. Patient will benefit from ongoing therapy at discharge to maximize functional independence and safety. Encouraged patient and girlfriend to utilize RW at discharge for safety and balance, both verbalized understanding.         If plan is discharge home, recommend the following: A little help with walking and/or transfers;A little help with bathing/dressing/bathroom;Assistance with cooking/housework;Supervision due to cognitive status;Assist for transportation;Help with stairs or ramp for entrance   Can travel by private vehicle        Equipment Recommendations Rolling Eliga Arvie (2 wheels)  Recommendations for Other Services       Functional Status Assessment Patient has had a recent decline in their functional status and demonstrates the ability to make significant improvements in function in a reasonable and predictable amount of time.     Precautions / Restrictions Precautions Precautions: Fall Recall of Precautions/Restrictions: Intact Restrictions Weight Bearing Restrictions Per Provider Order: No      Mobility   Bed Mobility Overal bed mobility: Needs Assistance Bed Mobility: Supine to Sit, Sit to Supine     Supine to sit: Supervision Sit to supine: Supervision   General bed mobility comments: increased time required to complete    Transfers Overall transfer level: Needs assistance Equipment used: 1 person hand held assist Transfers: Sit to/from Stand Sit to Stand: Supervision                Ambulation/Gait Ambulation/Gait assistance: Contact guard assist, Supervision Gait Distance (Feet): 200 Feet Assistive device: 1 person hand held assist Gait Pattern/deviations: Step-through pattern, Decreased stride length Gait velocity: decreased     General Gait Details: initially unsteady with HHAx1 requiring CGA but able to progress to supervision with HHAx1. Unable to let go of therapist's hand due to uncomfortable with balance  Stairs            Wheelchair Mobility     Tilt Bed    Modified Rankin (Stroke Patients Only)       Balance Overall balance assessment: Mild deficits observed, not formally tested                                           Pertinent Vitals/Pain Pain Assessment Pain Assessment: No/denies pain    Home Living Family/patient expects to be discharged to:: Private residence Living Arrangements: Alone Available Help at Discharge: Family;Available 24 hours/day Type of Home: Apartment Home Access: Elevator       Home Layout: One level Home Equipment: None      Prior Function Prior Level of Function : Independent/Modified Independent  Extremity/Trunk Assessment   Upper Extremity Assessment Upper Extremity Assessment: Defer to OT evaluation    Lower Extremity Assessment Lower Extremity Assessment: Generalized weakness    Cervical / Trunk Assessment Cervical / Trunk Assessment: Kyphotic  Communication   Communication Communication: No apparent difficulties    Cognition Arousal:  Alert Behavior During Therapy: WFL for tasks assessed/performed   PT - Cognitive impairments: No apparent impairments                         Following commands: Intact       Cueing       General Comments      Exercises     Assessment/Plan    PT Assessment Patient needs continued PT services  PT Problem List Decreased strength;Decreased activity tolerance;Decreased balance;Decreased mobility;Decreased cognition;Decreased knowledge of use of DME;Decreased safety awareness;Decreased knowledge of precautions       PT Treatment Interventions DME instruction;Gait training;Therapeutic activities;Therapeutic exercise;Balance training;Functional mobility training;Neuromuscular re-education;Patient/family education    PT Goals (Current goals can be found in the Care Plan section)  Acute Rehab PT Goals Patient Stated Goal: to go home PT Goal Formulation: With patient Time For Goal Achievement: 09/04/23 Potential to Achieve Goals: Good    Frequency Min 2X/week     Co-evaluation               AM-PAC PT "6 Clicks" Mobility  Outcome Measure Help needed turning from your back to your side while in a flat bed without using bedrails?: A Little Help needed moving from lying on your back to sitting on the side of a flat bed without using bedrails?: A Little Help needed moving to and from a bed to a chair (including a wheelchair)?: A Little Help needed standing up from a chair using your arms (e.g., wheelchair or bedside chair)?: A Little Help needed to walk in hospital room?: A Little Help needed climbing 3-5 steps with a railing? : A Little 6 Click Score: 18    End of Session Equipment Utilized During Treatment: Gait belt Activity Tolerance: Patient tolerated treatment well Patient left: in bed;with bed alarm set;with call bell/phone within reach Nurse Communication: Mobility status PT Visit Diagnosis: Unsteadiness on feet (R26.81);Muscle weakness (generalized)  (M62.81)    Time: 0865-7846 PT Time Calculation (min) (ACUTE ONLY): 23 min   Charges:   PT Evaluation $PT Eval Moderate Complexity: 1 Mod PT Treatments $Therapeutic Activity: 8-22 mins PT General Charges $$ ACUTE PT VISIT: 1 Visit         Maylon Peppers, PT, DPT Physical Therapist - [facility]  [facility]   Minoru Chap A Danayah Smyre 08/21/2023, 2:18 PM

## 2023-08-21 NOTE — Care Management Obs Status (Signed)
 MEDICARE OBSERVATION STATUS NOTIFICATION   Patient Details  Name: Johnathan Byrd MRN: [id number] Date of Birth: 10-Apr-1931   Medicare Observation Status Notification Given:       Marcell Anger 08/21/2023, 3:56 PM

## 2023-08-21 NOTE — Evaluation (Signed)
 Occupational Therapy Evaluation Patient Details Name: Johnathan Byrd MRN: [id number] DOB: November 26, 1930 Today's Date: 08/21/2023   History of Present Illness   89 y/o male presented to ED on 08/20/23 for AMS. Admitted for hyponatremia and AMS with unclear etiology. PMH: BPH, Afib, prediabetes, CHF, CKD stage IIIa     Clinical Impressions Pt was seen for OT evaluation this date. Prior to hospital admission, pt was residing at [facility] where he was IND with mobility and ADLs/IADLs.   Pt presents to acute OT demonstrating impaired ADL performance and functional mobility 2/2 weakness and balance deficits. Pt currently requires SUP to Min A for bed mobility with posterior lean upon initial stand requiring assist. SUP for seated LB dressing to doff/don socks. SUP for STS from EOB to RW, ambulation to the bathroom and back and for toilet transfer. Cueing to push up from bed/toilet vs pulling on RW, although pt continued to pull/push up from walker. Condom cath came off and gown got wet-min A for gown change to manage IV. Edu to use urinal or call nurse when needing to use the rest room. Pt verbalized understanding. Pt would benefit from skilled OT services to address noted impairments and functional limitations (see below for any additional details) in order to maximize safety and independence while minimizing falls risk and caregiver burden. Do anticipate the need for follow up OT services upon acute hospital DC.       If plan is discharge home, recommend the following:   A little help with walking and/or transfers;A little help with bathing/dressing/bathroom;Assist for transportation;Help with stairs or ramp for entrance;Assistance with cooking/housework     Functional Status Assessment   Patient has had a recent decline in their functional status and demonstrates the ability to make significant improvements in function in a reasonable and predictable amount of time.     Equipment  Recommendations   Other (comment);Tub/shower seat (RW)     Recommendations for Other Services         Precautions/Restrictions   Precautions Precautions: Fall Recall of Precautions/Restrictions: Intact Restrictions Weight Bearing Restrictions Per Provider Order: No     Mobility Bed Mobility Overal bed mobility: Needs Assistance Bed Mobility: Supine to Sit, Sit to Supine     Supine to sit: Min assist Sit to supine: Supervision   General bed mobility comments: increased time and noted to fall backwards needing Min A to bring trunk upright    Transfers Overall transfer level: Needs assistance Equipment used: Rolling walker (2 wheels) Transfers: Sit to/from Stand Sit to Stand: Supervision           General transfer comment: despite cueing to push up from bed and grab bars, pt pulls up on RW from seated EOB and toilet      Balance Overall balance assessment: Mild deficits observed, not formally tested                                         ADL either performed or assessed with clinical judgement   ADL Overall ADL's : Needs assistance/impaired                 Upper Body Dressing : Minimal assistance;Sitting Upper Body Dressing Details (indicate cue type and reason): to doff/don gown around IV     Toilet Transfer: Supervision/safety;Contact guard assist;Rolling walker (2 wheels)   Toileting- Clothing Manipulation and Hygiene: Sitting/lateral lean;Supervision/safety  Functional mobility during ADLs: Supervision/safety;Rolling walker (2 wheels)       Vision         Perception         Praxis         Pertinent Vitals/Pain Pain Assessment Pain Assessment: No/denies pain     Extremity/Trunk Assessment Upper Extremity Assessment Upper Extremity Assessment: Overall WFL for tasks assessed   Lower Extremity Assessment Lower Extremity Assessment: Generalized weakness   Cervical / Trunk Assessment Cervical / Trunk  Assessment: Kyphotic   Communication Communication Communication: No apparent difficulties   Cognition Arousal: Alert Behavior During Therapy: WFL for tasks assessed/performed                                 Following commands: Intact       Cueing  General Comments      condom cath came off while seated on toilet; notified NT who reports to use urinal or call to go to toilet   Exercises Other Exercises Other Exercises: Edu on role of OT in acute setting.   Shoulder Instructions      Home Living Family/patient expects to be discharged to:: Private residence [facility]) Living Arrangements: Alone Available Help at Discharge: Family;Available 24 hours/day Type of Home: Apartment Home Access: Elevator     Home Layout: One level     Bathroom Shower/Tub: Producer, television/film/video: Handicapped height     Home Equipment: None          Prior Functioning/Environment Prior Level of Function : Independent/Modified Independent                    OT Problem List: Decreased strength;Decreased activity tolerance;Impaired balance (sitting and/or standing)   OT Treatment/Interventions: Self-care/ADL training;Balance training;Therapeutic exercise;Therapeutic activities;Patient/family education      OT Goals(Current goals can be found in the care plan section)   Acute Rehab OT Goals Patient Stated Goal: return home OT Goal Formulation: With patient Time For Goal Achievement: 09/04/23 Potential to Achieve Goals: Good ADL Goals Pt Will Perform Lower Body Bathing: with supervision;with modified independence;sit to/from stand;sitting/lateral leans Pt Will Perform Lower Body Dressing: with supervision;with modified independence;sitting/lateral leans;sit to/from stand Pt Will Transfer to Toilet: with modified independence;with supervision;ambulating;regular height toilet   OT Frequency:  Min 2X/week    Co-evaluation               AM-PAC OT "6 Clicks" Daily Activity     Outcome Measure Help from another person eating meals?: None Help from another person taking care of personal grooming?: None Help from another person toileting, which includes using toliet, bedpan, or urinal?: A Little Help from another person bathing (including washing, rinsing, drying)?: A Little Help from another person to put on and taking off regular upper body clothing?: None Help from another person to put on and taking off regular lower body clothing?: A Little 6 Click Score: 21   End of Session Equipment Utilized During Treatment: Rolling walker (2 wheels)  Activity Tolerance: Patient tolerated treatment well Patient left: in bed;with call bell/phone within reach;with bed alarm set  OT Visit Diagnosis: Other abnormalities of gait and mobility (R26.89);Unsteadiness on feet (R26.81)                Time: 1610-9604 OT Time Calculation (min): 21 min Charges:  OT General Charges $OT Visit: 1 Visit OT Evaluation $OT Eval Moderate Complexity: 1 Mod  OT Treatments $Self Care/Home Management : 8-22 mins Millicent Blazejewski, OTR/L 08/21/23, 3:45 PM  Elzena Muston E Quantavious Eggert 08/21/2023, 3:41 PM

## 2023-08-21 NOTE — Progress Notes (Signed)
 Mobility Specialist - Progress Note   08/21/23 1000  Mobility  Activity Ambulated with assistance in hallway;Transferred from bed to chair  Level of Assistance Standby assist, set-up cues, supervision of patient - no hands on  Assistive Device Front wheel walker;None  Distance Ambulated (ft) 180 ft  Activity Response Tolerated well  Mobility visit 1 Mobility     Pt lying in bed upon arrival, utilizing RA. Pt pleasant and agreeable to activity. AO to self, location, and year. Pt performed active supine therex prior to completing bed mobility with HHA. STS and ambulation with minG. Pt denied dizziness, but reports feeling disoriented upon initial stand that did resolve. VC for staying close/inside + pushing RW with good carryover. Pt ambulated last 55' without AD. No LOB. Pt left in chair with alarm set, needs in reach. Family at bedside.   Filiberto Pinks Mobility Specialist 08/21/23, 10:20 AM

## 2023-08-22 ENCOUNTER — Ambulatory Visit: Admitting: Student

## 2023-08-22 DIAGNOSIS — R410 Disorientation, unspecified: Secondary | ICD-10-CM | POA: Diagnosis not present

## 2023-08-22 LAB — BASIC METABOLIC PANEL WITH GFR
Anion gap: 8 (ref 5–15)
BUN: 20 mg/dL (ref 8–23)
CO2: 21 mmol/L — ABNORMAL LOW (ref 22–32)
Calcium: 8.6 mg/dL — ABNORMAL LOW (ref 8.9–10.3)
Chloride: 99 mmol/L (ref 98–111)
Creatinine, Ser: 1.1 mg/dL (ref 0.61–1.24)
GFR, Estimated: >60 mL/min (ref >60)
Glucose, Bld: 98 mg/dL (ref 70–99)
Potassium: 3.8 mmol/L (ref 3.5–5.1)
Sodium: 128 mmol/L — ABNORMAL LOW (ref 135–145)

## 2023-08-22 LAB — SODIUM, URINE, RANDOM: Sodium, Ur: 74 mmol/L

## 2023-08-22 LAB — CORTISOL: Cortisol, Plasma: 14.9 ug/dL

## 2023-08-22 LAB — TSH: TSH: 2.21 u[IU]/mL (ref 0.350–4.500)

## 2023-08-22 LAB — OSMOLALITY: Osmolality: 273 mosm/kg — ABNORMAL LOW (ref 275–295)

## 2023-08-22 LAB — OSMOLALITY, URINE: Osmolality, Ur: 329 mosm/kg (ref 300–900)

## 2023-08-22 MED ORDER — SODIUM CHLORIDE 1 G PO TABS
1.0000 g | ORAL_TABLET | Freq: Three times a day (TID) | ORAL | Status: DC
  Administered 2023-08-22 – 2023-08-23 (×4): 1 g via ORAL
  Filled 2023-08-22 (×4): qty 1

## 2023-08-22 MED ORDER — ENSURE ENLIVE PO LIQD
237.0000 mL | Freq: Three times a day (TID) | ORAL | Status: DC
Start: 2023-08-22 — End: 2023-08-23
  Administered 2023-08-22 – 2023-08-23 (×3): 237 mL via ORAL

## 2023-08-22 MED ORDER — ADULT MULTIVITAMIN W/MINERALS CH
1.0000 | ORAL_TABLET | Freq: Every day | ORAL | Status: DC
  Administered 2023-08-23: 1 via ORAL
  Filled 2023-08-22: qty 1

## 2023-08-22 NOTE — Progress Notes (Addendum)
 Initial Nutrition Assessment  DOCUMENTATION CODES:   Not applicable  INTERVENTION:   Ensure Enlive po BID, each supplement provides 350 kcal and 20 grams of protein.  Magic cup TID with meals, each supplement provides 290 kcal and 9 grams of protein  MVI po daily   Liberalize diet   Pt at high refeed risk; recommend monitor potassium, magnesium and phosphorus labs daily until stable  Daily weights   NUTRITION DIAGNOSIS:   Inadequate oral intake related to acute illness as evidenced by per patient/family report.  GOAL:   Patient will meet greater than or equal to 90% of their needs  MONITOR:   PO intake, Supplement acceptance, Labs, Weight trends, I & O's, Skin  REASON FOR ASSESSMENT:   Malnutrition Screening Tool    ASSESSMENT:   89 y/o male with h/o Afib, BPH, CHF, CKD III, B12 deficiency, chronic constipation and resides in SNF who is admitted with UTI and AMS.  Met with pt in room today. Pt reports that he is feeling better today. Pt reports good appetite and oral intake at baseline but reports that he does not believe he was eating well for the past few days. Pt ate a few bites of beans and potatoes along with an ice cream for lunch today. Pt is documented to be eating 60-100% of meals in hospital. RD discussed with pt the importance of adequate nutrition needed to preserve lean muscle. Pt is agreeable to drinking strawberry Ensure. RD will add supplements and MVI to help pt meet his estimated needs. RD will also liberalize pt's diet. Pt is likely at refeed risk. Per chart, pt appears to be down ~9lbs(5%) over the past month; this is significant weight loss. Pt report his UBW is ~183lbs. Pt reporting constipation today.   Medications reviewed and include: amoxicillin, miralax, NaCl tabs  Labs reviewed: Na 128(L), K 3.8 wnl  NUTRITION - FOCUSED PHYSICAL EXAM:  Flowsheet Row Most Recent Value  Orbital Region No depletion  Upper Arm Region Moderate depletion   Thoracic and Lumbar Region No depletion  Buccal Region No depletion  Temple Region Mild depletion  Clavicle Bone Region Moderate depletion  Clavicle and Acromion Bone Region Moderate depletion  Scapular Bone Region No depletion  Dorsal Hand Moderate depletion  Patellar Region Severe depletion  Anterior Thigh Region Severe depletion  Posterior Calf Region Severe depletion  Edema (RD Assessment) None  Hair Reviewed  Eyes Reviewed  Mouth Reviewed  Skin Reviewed  Nails Reviewed   Diet Order:   Diet Order             Diet Heart Fluid consistency: Thin  Diet effective now                  EDUCATION NEEDS:   Education needs have been addressed  Skin:  Skin Assessment: Reviewed RN Assessment (ecchymosis)  Last BM:  4/8  Height:   Ht Readings from Last 1 Encounters:  08/21/23 5' 7.01" (1.702 m)    Weight:   Wt Readings from Last 1 Encounters:  08/22/23 78.4 kg    Ideal Body Weight:  67.2 kg  BMI:  Body mass index is 27.06 kg/m.  Estimated Nutritional Needs:   Kcal:  1700-2000kcal/day  Protein:  85-100g/day  Fluid:  1.7-2.0L/day  Betsey Holiday MS, RD, LDN If unable to be reached, please send secure chat to "RD inpatient" available from 8:00a-4:00p daily

## 2023-08-22 NOTE — Progress Notes (Signed)
 Progress Note    Johnathan Byrd  ZOX:[id number] DOB: November 09, 1930  DOA: 08/20/2023 PCP: Kandyce Rud, MD      Brief Narrative:    Medical records reviewed and are as summarized below:  Johnathan Byrd is a 89 y.o. male  with medical history significant of BPH with LUTS, elevated PSA, persistent atrial fibrillation, prediabetes, chronic diastolic heart failure, CKD stage IIIa, BPPV, glaucoma, vitamin B12 deficiency, who presented to the hospital because of altered mental status.  Patient cannot provide any history on presentation because of confusion.  According to [facility], his girlfriend, patient was noted to be confused while standing in the bathroom when he had an episode of urinary incontinence.  He was disoriented and could not tell the staff what had occurred.  At baseline, he is normally oriented x 4.  He has some mild unsteady gait at baseline but he is usually able to ambulate.  He saw Dr. Lonna Cobb, urologist, for dysuria on 08/09/2023 and was prescribed cefadroxil for 7 days.  It appears he was switched to amoxicillin. Of note, he got medication for hiccups when he was in the ER on 08/18/2023 (chart review showed this was Thorazine.).   His PCP started him on 2 medications recently but she was unsure whether patient has started these medicines.  Chart review shows that he saw his PCP on 08/19/2023 and he was prescribed baclofen for hiccups.     Assessment/Plan:   Principal Problem:   Altered mental status Active Problems:   Pyuria   Persistent atrial fibrillation (HCC)   BPH (benign prostatic hyperplasia)   Chronic diastolic heart failure (HCC)   Body mass index is 27.06 kg/m.   Altered mental status: Improving.  Etiology unclear.  May be due to metabolic encephalopathy from hyponatremia versus toxic encephalopathy from baclofen.  Clinically undetermined at this time.  Mental status has improved but his girlfriend said that he is still not at baseline.   Hyponatremia:  Sodium level is trending downward (1 23-1 31-1 28).  Discontinue IV normal saline infusion.  Start salt tablet.  Repeat BMP tomorrow.   Cortisol 14.9, TSH 2.210, urine osmolality 273, urine osmolality 329 and urine sodium 74.   Constiption /abdominal distension: Patient said he moved his bowels yesterday and this morning although nothing has been documented in the chart..  Continue laxatives as needed.    General weakness: PT and OT recommended home health therapy   Persistent atrial fibrillation: Continue Eliquis and metoprolol   Recent UTI: Continue amoxicillin.  This was prescribed by Dr. Lonna Cobb on 08/15/2023 but it appears he started this on 08/19/2023.  (strep mitis isolated from urine culture on 08/09/2023) Intermittent gross hematuria, BPH, varicocele and hydrocele on CT pelvis, elevated PSA (53.54) on 08/08/2023: Dr. Lonna Cobb, urologist, had recommended that he restart Flomax.  Continue finasteride. Outpatient follow-up with Dr. Lonna Cobb, urologist.   Intermittent hiccups: He was recently prescribed baclofen by PCP on 08/19/2023.  Baclofen has been held.  He was also prescribed Thorazine by ED physician on 08/2023.   Comorbidities include chronic diastolic CHF, CKD stage IIIa   Diet Order             Diet Heart Fluid consistency: Thin  Diet effective now                            Consultants: None  Procedures: None    Medications:    amoxicillin  500 mg Oral  TID   apixaban  5 mg Oral BID   brimonidine  1 drop Right Eye BID   finasteride  5 mg Oral Daily   loratadine  10 mg Oral Daily   metoprolol succinate  50 mg Oral QPM   polyethylene glycol  17 g Oral Daily   sodium chloride flush  3 mL Intravenous Q12H   sodium chloride  1 g Oral TID WC   tamsulosin  0.4 mg Oral QPC supper   Continuous Infusions:   Anti-infectives (From admission, onward)    Start     Dose/Rate Route Frequency Ordered Stop   08/21/23 1000  amoxicillin (AMOXIL) capsule 500 mg         500 mg Oral 3 times daily 08/20/23 2003 08/28/23 0959   08/20/23 1730  cefTRIAXone (ROCEPHIN) 1 g in sodium chloride 0.9 % 100 mL IVPB        1 g 200 mL/hr over 30 Minutes Intravenous  Once 08/20/23 1716 08/20/23 1920              Family Communication/Anticipated D/C date and plan/Code Status   DVT prophylaxis:  apixaban (ELIQUIS) tablet 5 mg     Code Status: Limited: Do not attempt resuscitation (DNR) -DNR-LIMITED -Do Not Intubate/DNI   Family Communication: Plan discussed with Johnathan Byrd, girlfriend and Johnathan Byrd (son) at the bedside Disposition Plan: Plan to discharge to [facility] facility   Status is: Observation The patient will require care spanning > 2 midnights and should be moved to inpatient because: General weakness, confusion       Subjective:   Interval events noted.  He has no complaints.  He feels better.  His girlfriend Rexford Maus) and son Johnathan Byrd) were at the bedside.  Johnathan Byrd said that patient's mental status has improved compared to yesterday but he's still not at baseline.  Objective:    Vitals:   08/21/23 1627 08/21/23 1938 08/22/23 0340 08/22/23 0742  BP: 111/62 105/80 (!) 129/91 (!) 140/81  Pulse: 63 80 72 87  Resp: 18 20 18 16   Temp: 98.3 F (36.8 C) 99 F (37.2 C) 98.9 F (37.2 C)   TempSrc: Oral Oral Oral   SpO2: 95% 94% 97% 96%  Weight:   78.4 kg   Height:       No data found.   Intake/Output Summary (Last 24 hours) at 08/22/2023 1233 Last data filed at 08/22/2023 0945 Gross per 24 hour  Intake 1184.08 ml  Output 400 ml  Net 784.08 ml   Filed Weights   08/21/23 0405 08/21/23 0530 08/22/23 0340  Weight: 79.8 kg 79.8 kg 78.4 kg    Exam:  GEN: NAD SKIN: Warm and dry EYES: No pallor or icterus ENT: MMM CV: RRR PULM: CTA B ABD: soft, ND, NT, +BS CNS: AAO x 3, non focal EXT: No edema or tenderness        Data Reviewed:   I have personally reviewed following labs and imaging studies:  Labs: Labs show the  following:   Basic Metabolic Panel: Recent Labs  Lab 08/18/23 1824 08/20/23 1543 08/21/23 0349 08/22/23 0444  NA 132* 133* 131* 128*  K 4.2 4.5 3.9 3.8  CL 103 98 102 99  CO2 20* 26 20* 21*  GLUCOSE 137* 108* 90 98  BUN 20 19 16 20   CREATININE 1.06 1.20 0.94 1.10  CALCIUM 9.0 9.3 8.8* 8.6*   GFR Estimated Creatinine Clearance: 41.7 mL/min (by C-G formula based on SCr of 1.1 mg/dL). Liver Function Tests: Recent Labs  Lab 08/20/23 1543 08/21/23 0349  AST 25 16  ALT 13 15  ALKPHOS 70 56  BILITOT 1.7* 1.3*  PROT 7.0 5.5*  ALBUMIN 4.1 3.3*   Recent Labs  Lab 08/18/23 2259 08/20/23 1543  LIPASE 88* 31   No results for input(s): "AMMONIA" in the last 168 hours. Coagulation profile No results for input(s): "INR", "PROTIME" in the last 168 hours.  CBC: Recent Labs  Lab 08/18/23 1824 08/20/23 1543 08/21/23 0349  WBC 6.9 6.8 6.1  HGB 14.2 15.1 13.6  HCT 41.6 43.3 38.6*  MCV 93.7 92.1 91.0  PLT 219 252 223   Cardiac Enzymes: No results for input(s): "CKTOTAL", "CKMB", "CKMBINDEX", "TROPONINI" in the last 168 hours. BNP (last 3 results) No results for input(s): "PROBNP" in the last 8760 hours. CBG: No results for input(s): "GLUCAP" in the last 168 hours. D-Dimer: No results for input(s): "DDIMER" in the last 72 hours. Hgb A1c: No results for input(s): "HGBA1C" in the last 72 hours. Lipid Profile: No results for input(s): "CHOL", "HDL", "LDLCALC", "TRIG", "CHOLHDL", "LDLDIRECT" in the last 72 hours. Thyroid function studies: Recent Labs    08/22/23 0758  TSH 2.210   Anemia work up: No results for input(s): "VITAMINB12", "FOLATE", "FERRITIN", "TIBC", "IRON", "RETICCTPCT" in the last 72 hours. Sepsis Labs: Recent Labs  Lab 08/18/23 1824 08/20/23 1543 08/21/23 0349  WBC 6.9 6.8 6.1    Microbiology Recent Results (from the past 240 hours)  Resp panel by RT-PCR (RSV, Flu A&B, Covid) Anterior Nasal Swab     Status: None   Collection Time: 08/20/23   3:54 PM   Specimen: Anterior Nasal Swab  Result Value Ref Range Status   SARS Coronavirus 2 by RT PCR NEGATIVE NEGATIVE Final    Comment: (NOTE) SARS-CoV-2 target nucleic acids are NOT DETECTED.  The SARS-CoV-2 RNA is generally detectable in upper respiratory specimens during the acute phase of infection. The lowest concentration of SARS-CoV-2 viral copies this assay can detect is 138 copies/mL. A negative result does not preclude SARS-Cov-2 infection and should not be used as the sole basis for treatment or other patient management decisions. A negative result may occur with  improper specimen collection/handling, submission of specimen other than nasopharyngeal swab, presence of viral mutation(s) within the areas targeted by this assay, and inadequate number of viral copies(<138 copies/mL). A negative result must be combined with clinical observations, patient history, and epidemiological information. The expected result is Negative.  Fact Sheet for Patients:  BloggerCourse.com  Fact Sheet for Healthcare Providers:  SeriousBroker.it  This test is no t yet approved or cleared by the [place] FDA and  has been authorized for detection and/or diagnosis of SARS-CoV-2 by FDA under an Emergency Use Authorization (EUA). This EUA will remain  in effect (meaning this test can be used) for the duration of the COVID-19 declaration under Section 564(b)(1) of the Act, 21 U.S.C.section 360bbb-3(b)(1), unless the authorization is terminated  or revoked sooner.       Influenza A by PCR NEGATIVE NEGATIVE Final   Influenza B by PCR NEGATIVE NEGATIVE Final    Comment: (NOTE) The Xpert Xpress SARS-CoV-2/FLU/RSV plus assay is intended as an aid in the diagnosis of influenza from Nasopharyngeal swab specimens and should not be used as a sole basis for treatment. Nasal washings and aspirates are unacceptable for Xpert Xpress  SARS-CoV-2/FLU/RSV testing.  Fact Sheet for Patients: BloggerCourse.com  Fact Sheet for Healthcare Providers: SeriousBroker.it  This test is not yet approved or cleared by the  [place] [employer] trader and has been authorized for detection and/or diagnosis of SARS-CoV-2 by FDA under an TEFL teacher (EUA). This EUA will remain in effect (meaning this test can be used) for the duration of the COVID-19 declaration under Section 564(b)(1) of the Act, 21 U.S.C. section 360bbb-3(b)(1), unless the authorization is terminated or revoked.     Resp Syncytial Virus by PCR NEGATIVE NEGATIVE Final    Comment: (NOTE) Fact Sheet for Patients: BloggerCourse.com  Fact Sheet for Healthcare Providers: SeriousBroker.it  This test is not yet approved or cleared by the [place] FDA and has been authorized for detection and/or diagnosis of SARS-CoV-2 by FDA under an Emergency Use Authorization (EUA). This EUA will remain in effect (meaning this test can be used) for the duration of the COVID-19 declaration under Section 564(b)(1) of the Act, 21 U.S.C. section 360bbb-3(b)(1), unless the authorization is terminated or revoked.  Performed at [facility], [address], [place], [place] [address]   Urine Culture     Status: None   Collection Time: 08/20/23  4:33 PM   Specimen: Urine, Clean Catch  Result Value Ref Range Status   Specimen Description   Final    URINE, CLEAN CATCH Performed at [facility], [address] [place], [place] [address]    Special Requests   Final    NONE Performed at [facility], [address] [place], [place] [address]    Culture   Final    NO GROWTH Performed at [facility], [address], [place], [place] [address]    Report Status 08/21/2023 FINAL  Final    Procedures and diagnostic studies:  CT CERVICAL  SPINE WO CONTRAST Result Date: 08/20/2023 CLINICAL DATA:  Neck pain EXAM: CT CERVICAL SPINE WITHOUT CONTRAST TECHNIQUE: Multidetector CT imaging of the cervical spine was performed without intravenous contrast. Multiplanar CT image reconstructions were also generated. RADIATION DOSE REDUCTION: This exam was performed according to the departmental dose-optimization program which includes automated exposure control, adjustment of the mA and/or kV according to patient size and/or use of iterative reconstruction technique. COMPARISON:  None Available. FINDINGS: Alignment: No subluxation.  Facet alignment within normal limits Skull base and vertebrae: No acute fracture. No primary bone lesion or focal pathologic process. Soft tissues and spinal canal: No prevertebral fluid or swelling. No visible canal hematoma. Disc levels: Moderate severe multilevel degenerative change with diffuse disc space narrowing and degenerative osteophyte. Possible mild ossification of posterior longitudinal low ligament at C5-C6. Multilevel facet degenerative changes with foraminal narrowing, worst at C4-C5 and C5-C6. Upper chest: Negative. Other: None IMPRESSION: 1. Negative for acute osseous abnormality. 2. Moderate to severe multilevel degenerative change. Electronically Signed   By: Jasmine Pang M.D.   On: 08/20/2023 21:20   CT ABDOMEN PELVIS WO CONTRAST Result Date: 08/20/2023 CLINICAL DATA:  Altered mental status this morning. Found in the bathroom with his clothes on and had urinated on himself. Neck pain. EXAM: CT ABDOMEN AND PELVIS WITHOUT CONTRAST TECHNIQUE: Multidetector CT imaging of the abdomen and pelvis was performed following the standard protocol without IV contrast. RADIATION DOSE REDUCTION: This exam was performed according to the departmental dose-optimization program which includes automated exposure control, adjustment of the mA and/or kV according to patient size and/or use of iterative reconstruction technique.  COMPARISON:  08/19/2023 FINDINGS: Lower chest: Enlarged heart. Mild aortic and coronary artery calcifications. Minimal bibasilar atelectasis. Hepatobiliary: Interval sludge in the gallbladder. Small liver containing 2 previously demonstrated small cysts, not  needing imaging follow-up. Pancreas: Unremarkable. No pancreatic ductal dilatation or surrounding inflammatory changes. Spleen: Normal in size without focal abnormality. Adrenals/Urinary Tract: Previously demonstrated bilateral simple appearing renal cysts not needing imaging follow-up. Unremarkable adrenal glands and ureters. Poorly distended urinary bladder containing interval medium density fluid measuring 54 Hounsfield units in density. Stomach/Bowel: Stomach is within normal limits. Appendix appears normal. No evidence of bowel wall thickening, distention, or inflammatory changes. Vascular/Lymphatic: Atheromatous arterial calcifications without aneurysm. No enlarged lymph nodes. Reproductive: Markedly enlarged prostate gland. Moderate-sized bilateral hydroceles. Probable left scrotal varicocele. Other: Small right inguinal hernia containing fat, small to moderate-sized left inguinal hernia containing fat and tiny umbilical hernia containing fat. Musculoskeletal: Mild bilateral hip degenerative changes. Moderate lumbar and lower thoracic spine degenerative changes with changes of DISH. IMPRESSION: 1. Interval medium density fluid in the urinary bladder, consistent with hemorrhage. 2. Interval sludge in the gallbladder. 3. Markedly enlarged prostate gland. 4. Moderate-sized bilateral hydroceles. 5. Probable left scrotal varicocele. 6. Cardiomegaly. 7. Mild calcific coronary artery and aortic atherosclerosis. Electronically Signed   By: Beckie Salts M.D.   On: 08/20/2023 18:41   CT HEAD WO CONTRAST ( ) Result Date: 08/20/2023 CLINICAL DATA:  89 year old male with altered mental status, found in bathroom. EXAM: CT HEAD WITHOUT CONTRAST TECHNIQUE: Contiguous  axial images were obtained from the base of the skull through the vertex without intravenous contrast. RADIATION DOSE REDUCTION: This exam was performed according to the departmental dose-optimization program which includes automated exposure control, adjustment of the mA and/or kV according to patient size and/or use of iterative reconstruction technique. COMPARISON:  Brain MRI 07/29/2020. FINDINGS: Brain: Stable cerebral volume. No midline shift, ventriculomegaly, mass effect, evidence of mass lesion, intracranial hemorrhage or evidence of cortically based acute infarction. Patchy and confluent bilateral cerebral white matter hypodensity appears stable to FLAIR signal changes on previous MRI. Vascular: Calcified atherosclerosis at the skull base. No suspicious intracranial vascular hyperdensity. Skull: Intact.  No acute osseous abnormality identified. Sinuses/Orbits: Visualized paranasal sinuses and mastoids are stable and well aerated. Other: No acute orbit or scalp soft tissue finding. IMPRESSION: 1. No acute intracranial abnormality. 2. Stable appearance of the brain compared to 2022 MRI. Electronically Signed   By: Odessa Fleming M.D.   On: 08/20/2023 16:58               LOS: 0 days   Shalaina Guardiola  [facility]   Pager on [email or web address]. If 7PM-7AM, please contact night-coverage at [email or web address]     08/22/2023, 12:33 PM

## 2023-08-22 NOTE — Progress Notes (Signed)
 Physical Therapy Treatment Patient Details Name: Johnathan Byrd MRN: [id number] DOB: [birth year] Today's Date: 08/22/2023   History of Present Illness 89 y/o male presented to ED on 08/20/23 for AMS. Admitted for hyponatremia and AMS with unclear etiology. PMH: BPH, Afib, prediabetes, CHF, CKD stage IIIa    PT Comments  Pt resting in bed upon PT arrival; pt agreeable to therapy; pt's son present during session.  No c/o pain.  During session pt was CGA with transfer from bed and CGA progressing to SBA ambulating 2 laps around nursing station with RW use.  Will continue to focus on strengthening, balance, and progressive functional mobility during hospitalization.    If plan is discharge home, recommend the following: A little help with walking and/or transfers;A little help with bathing/dressing/bathroom;Assistance with cooking/housework;Supervision due to cognitive status;Assist for transportation;Help with stairs or ramp for entrance   Can travel by private vehicle        Equipment Recommendations  Rolling walker (2 wheels)    Recommendations for Other Services       Precautions / Restrictions Precautions Precautions: Fall Restrictions Weight Bearing Restrictions Per Provider Order: No     Mobility  Bed Mobility Overal bed mobility: Needs Assistance Bed Mobility: Supine to Sit, Sit to Supine     Supine to sit: Min assist Sit to supine: Supervision   General bed mobility comments: assist for trunk semi-supine to sitting EOB (increased effort for pt to perform as much as possible on own; pt tending to lean backwards; use of bed rail)    Transfers Overall transfer level: Needs assistance Equipment used: Rolling walker (2 wheels) Transfers: Sit to/from Stand Sit to Stand: Contact guard assist           General transfer comment: vc's for UE positioning/to push off of bed to stand and reach back prior to sitting    Ambulation/Gait Ambulation/Gait assistance: Contact  guard assist, Supervision Gait Distance (Feet): 360 Feet Assistive device: Rolling walker (2 wheels) Gait Pattern/deviations: Step-through pattern, Decreased step length - right, Decreased step length - left Gait velocity: decreased     General Gait Details: steady ambulation with RW use; vc's for upright posture and to stay closer to [employer] Mobility     Tilt Bed    Modified Rankin (Stroke Patients Only)       Balance Overall balance assessment: Needs assistance Sitting-balance support: No upper extremity supported, Feet supported Sitting balance-Leahy Scale: Good Sitting balance - Comments: steady reaching within BOS   Standing balance support: Bilateral upper extremity supported, During functional activity, Reliant on assistive device for balance Standing balance-Leahy Scale: Good Standing balance comment: steady ambulating with RW use                            Communication Communication Communication: Impaired Factors Affecting Communication: Hearing impaired  Cognition Arousal: Alert Behavior During Therapy: WFL for tasks assessed/performed   PT - Cognitive impairments: No apparent impairments                         Following commands: Impaired Following commands impaired: Follows one step commands with increased time, Follows multi-step commands inconsistently    Cueing Cueing Techniques: Verbal cues, Gestural cues, Tactile cues, Visual cues  Exercises      General Comments  Pt agreeable to PT session.  Pertinent Vitals/Pain Pain Assessment Pain Assessment: No/denies pain Post ambulation pt's HR 97 bpm and SpO2 sats 98% on room air.    Home Living                          Prior Function            PT Goals (current goals can now be found in the care plan section) Acute Rehab PT Goals Patient Stated Goal: to go home PT Goal Formulation: With patient Time For Goal  Achievement: 09/04/23 Potential to Achieve Goals: Good Progress towards PT goals: Progressing toward goals    Frequency    Min 2X/week      PT Plan      Co-evaluation              AM-PAC PT "6 Clicks" Mobility   Outcome Measure  Help needed turning from your back to your side while in a flat bed without using bedrails?: A Little Help needed moving from lying on your back to sitting on the side of a flat bed without using bedrails?: A Little Help needed moving to and from a bed to a chair (including a wheelchair)?: A Little Help needed standing up from a chair using your arms (e.g., wheelchair or bedside chair)?: A Little Help needed to walk in hospital room?: A Little Help needed climbing 3-5 steps with a railing? : A Little 6 Click Score: 18    End of Session Equipment Utilized During Treatment: Gait belt Activity Tolerance: Patient tolerated treatment well Patient left: in bed;with call bell/phone within reach;with bed alarm set;with family/visitor present Nurse Communication: Mobility status;Precautions PT Visit Diagnosis: Unsteadiness on feet (R26.81);Muscle weakness (generalized) (M62.81)     Time: 2130-8657 PT Time Calculation (min) (ACUTE ONLY): 17 min  Charges:    $Therapeutic Activity: 8-22 mins PT General Charges $$ ACUTE PT VISIT: 1 Visit                     Hendricks Limes, PT 08/22/23, 5:32 PM

## 2023-08-22 NOTE — Plan of Care (Signed)
 ?  Problem: Education: ?Goal: Knowledge of General Education information will improve ?Description: Including pain rating scale, medication(s)/side effects and non-pharmacologic comfort measures ?Outcome: Progressing ?  ?Problem: Health Behavior/Discharge Planning: ?Goal: Ability to manage health-related needs will improve ?Outcome: Progressing ?  ?Problem: Coping: ?Goal: Level of anxiety will decrease ?Outcome: Progressing ?  ?

## 2023-08-23 DIAGNOSIS — R410 Disorientation, unspecified: Secondary | ICD-10-CM | POA: Diagnosis not present

## 2023-08-23 LAB — BASIC METABOLIC PANEL WITH GFR
Anion gap: 9 (ref 5–15)
BUN: 19 mg/dL (ref 8–23)
CO2: 23 mmol/L (ref 22–32)
Calcium: 9 mg/dL (ref 8.9–10.3)
Chloride: 97 mmol/L — ABNORMAL LOW (ref 98–111)
Creatinine, Ser: 0.94 mg/dL (ref 0.61–1.24)
GFR, Estimated: >60 mL/min (ref >60)
Glucose, Bld: 101 mg/dL — ABNORMAL HIGH (ref 70–99)
Potassium: 3.7 mmol/L (ref 3.5–5.1)
Sodium: 129 mmol/L — ABNORMAL LOW (ref 135–145)

## 2023-08-23 LAB — CBC
HCT: 40.8 % (ref 39.0–52.0)
Hemoglobin: 14.3 g/dL (ref 13.0–17.0)
MCH: 31.6 pg (ref 26.0–34.0)
MCHC: 35 g/dL (ref 30.0–36.0)
MCV: 90.3 fL (ref 80.0–100.0)
Platelets: 232 10*3/uL (ref 150–400)
RBC: 4.52 MIL/uL (ref 4.22–5.81)
RDW: 12.6 % (ref 11.5–15.5)
WBC: 7.5 10*3/uL (ref 4.0–10.5)
nRBC: 0 % (ref 0.0–0.2)

## 2023-08-23 LAB — MAGNESIUM: Magnesium: 2.1 mg/dL (ref 1.7–2.4)

## 2023-08-23 LAB — PHOSPHORUS: Phosphorus: 3 mg/dL (ref 2.5–4.6)

## 2023-08-23 MED ORDER — SODIUM CHLORIDE 1 G PO TABS: 1.0000 g | ORAL_TABLET | Freq: Two times a day (BID) | ORAL | 0 refills | Status: AC

## 2023-08-23 MED ORDER — TAMSULOSIN HCL 0.4 MG PO CAPS: 0.4000 mg | ORAL_CAPSULE | Freq: Every day | ORAL | Status: DC

## 2023-08-23 NOTE — Progress Notes (Signed)
 Patient being discharged accompanied by family via personal car. Patient discharge instructions gone over with patient and family. Prescription called in to [facility]. Patient in no pain or distress at time of discharge. Walker taken home with patient.

## 2023-08-23 NOTE — Plan of Care (Signed)

## 2023-08-23 NOTE — Discharge Summary (Signed)
 Physician Discharge Summary   Patient: Johnathan Byrd MRN: [id number] DOB: [birth year]  Admit date:     08/20/2023  Discharge date: 08/23/23  Discharge Physician: Lurene Shadow   PCP: Kandyce Rud, MD   Recommendations at discharge:   Follow-up with PCP in 1 week  Discharge Diagnoses: Principal Problem:   Altered mental status Active Problems:   Pyuria   Persistent atrial fibrillation (HCC)   BPH (benign prostatic hyperplasia)   Chronic diastolic heart failure (HCC)  Resolved Problems:   * No resolved hospital problems. *  Hospital Course:   Johnathan Byrd is a 89 y.o. male  with medical history significant of BPH with LUTS, elevated PSA, persistent atrial fibrillation, prediabetes, chronic diastolic heart failure, CKD stage IIIa, BPPV, glaucoma, vitamin B12 deficiency, who presented to the hospital because of altered mental status.  Patient cannot provide any history on presentation because of confusion.  According to [facility], his girlfriend, patient was noted to be confused while standing in the bathroom when he had an episode of urinary incontinence.  He was disoriented and could not tell the staff what had occurred.  At baseline, he is normally oriented x 4.  He has some mild unsteady gait at baseline but he is usually able to ambulate.   He saw Dr. Lonna Cobb, urologist, for dysuria on 08/09/2023 and was prescribed cefadroxil for 7 days.  It appears he was switched to amoxicillin. Of note, he got medication for hiccups when he was in the ER on 08/18/2023 (chart review showed this was Thorazine.).   His PCP started him on 2 medications recently but she was unsure whether patient has started these medicines.  Chart review shows that he saw his PCP on 08/19/2023 and he was prescribed baclofen for hiccups.  Assessment and Plan:  Altered mental status: Resolved.  Etiology unclear.  May be due to metabolic encephalopathy from hyponatremia versus toxic encephalopathy from baclofen.   Clinically undetermined at this time.   Brett Canales, son, said that he found newly filled baclofen bottle at home.  5 pills were missing from the bottle.  He said patient had gotten the prescription about 2 PM day prior to admission and was admitted to the hospital the following day.  He cannot confirm whether patient had taken all 5 pills.  Patient also said that he had dropped the Baclofen bottle on the floor and some of the pills had spilled on the floor.  He is still not clear whether patient had taken the baclofen or not.     Hyponatremia: Sodium level is stable.  ([phone]).     Continue sodium chloride tablet at discharge. Cortisol 14.9, TSH 2.210, urine osmolality 273, urine osmolality 329 and urine sodium 74.     Constiption /abdominal distension: No acute issues.  He is having regular bowel movements.  No acute abnormality on CT abdomen and pelvis.  He is laxatives as needed at home.     General weakness: PT and OT recommended home health therapy     Persistent atrial fibrillation: Continue Eliquis and metoprolol     Recent UTI: Completed amoxicillin.  This was prescribed by Dr. Lonna Cobb on 08/15/2023 but it appears he started this on 08/19/2023.  (strep mitis isolated from urine culture on 08/09/2023) Intermittent gross hematuria, BPH, varicocele and hydrocele on CT pelvis, elevated PSA (53.54) on 08/08/2023: Dr. Lonna Cobb, urologist, had recommended that he restart Flomax.  Continue finasteride. Outpatient follow-up with Dr. Lonna Cobb, urologist.     Intermittent hiccups: Improved.  Advised  not to continue Thorazine or baclofen at discharge.   He was recently prescribed baclofen by PCP on 08/19/2023.   He was also prescribed Thorazine by ED physician on 08/2023.     Comorbidities include chronic diastolic CHF, CKD stage IIIa    His condition has improved and he is deemed stable for discharge to home today.  Discharge plan was discussed with Brett Canales, son, at the bedside.       Consultants: None Procedures performed: None  Disposition: Home health Diet recommendation:  Discharge Diet Orders (From admission, onward)     Start     Ordered   08/23/23 0000  Diet - low sodium heart healthy        08/23/23 0909           Cardiac diet DISCHARGE MEDICATION: Allergies as of 08/23/2023       Reactions   Other Other (See Comments)   Unknown Unknown   Sulfa Antibiotics         Medication List     STOP taking these medications    amoxicillin 875 MG tablet Commonly known as: AMOXIL   baclofen 10 MG tablet Commonly known as: LIORESAL   chlorproMAZINE 10 MG tablet Commonly known as: THORAZINE       TAKE these medications    brimonidine 0.2 % ophthalmic solution Commonly known as: ALPHAGAN Place 1 drop into the right eye 2 (two) times daily.   Eliquis 5 MG Tabs tablet Generic drug: apixaban TAKE 1 TABLET(5 MG) BY MOUTH TWICE DAILY   finasteride 5 MG tablet Commonly known as: PROSCAR Take 1 tablet (5 mg total) by mouth daily.   metoprolol succinate 50 MG 24 hr tablet Commonly known as: TOPROL-XL TAKE 1 TABLET(50 MG) BY MOUTH EVERY EVENING   omeprazole 40 MG capsule Commonly known as: PRILOSEC Take 40 mg by mouth daily.   sodium chloride 1 g tablet Take 1 tablet (1 g total) by mouth 2 (two) times daily with a meal.   tamsulosin 0.4 MG Caps capsule Commonly known as: FLOMAX Take 1 capsule (0.4 mg total) by mouth daily after supper.   Vitamin B-12 2500 MCG Subl Place under the tongue.               Durable Medical Equipment  (From admission, onward)           Start     Ordered   08/23/23 0902  For home use only DME Walker rolling  Once       Question Answer Comment  Walker: With 5 Inch Wheels   Patient needs a walker to treat with the following condition Weakness      08/23/23 0902            Discharge Exam: Filed Weights   08/21/23 0530 08/22/23 0340 08/23/23 0417  Weight: 79.8 kg 78.4 kg 81.2 kg    GEN: NAD SKIN: Warm and dry EYES: No pallor or icterus ENT: MMM CV: RRR PULM: CTA B ABD: soft, ND, NT, +BS CNS: AAO x 3, non focal EXT: No edema or tenderness   Condition at discharge: good  The results of significant diagnostics from this hospitalization (including imaging, microbiology, ancillary and laboratory) are listed below for reference.   Imaging Studies: CT CERVICAL SPINE WO CONTRAST Result Date: 08/20/2023 CLINICAL DATA:  Neck pain EXAM: CT CERVICAL SPINE WITHOUT CONTRAST TECHNIQUE: Multidetector CT imaging of the cervical spine was performed without intravenous contrast. Multiplanar CT image reconstructions were also generated. RADIATION DOSE REDUCTION:  This exam was performed according to the departmental dose-optimization program which includes automated exposure control, adjustment of the mA and/or kV according to patient size and/or use of iterative reconstruction technique. COMPARISON:  None Available. FINDINGS: Alignment: No subluxation.  Facet alignment within normal limits Skull base and vertebrae: No acute fracture. No primary bone lesion or focal pathologic process. Soft tissues and spinal canal: No prevertebral fluid or swelling. No visible canal hematoma. Disc levels: Moderate severe multilevel degenerative change with diffuse disc space narrowing and degenerative osteophyte. Possible mild ossification of posterior longitudinal low ligament at C5-C6. Multilevel facet degenerative changes with foraminal narrowing, worst at C4-C5 and C5-C6. Upper chest: Negative. Other: None IMPRESSION: 1. Negative for acute osseous abnormality. 2. Moderate to severe multilevel degenerative change. Electronically Signed   By: Jasmine Pang M.D.   On: 08/20/2023 21:20   CT ABDOMEN PELVIS WO CONTRAST Result Date: 08/20/2023 CLINICAL DATA:  Altered mental status this morning. Found in the bathroom with his clothes on and had urinated on himself. Neck pain. EXAM: CT ABDOMEN AND PELVIS WITHOUT  CONTRAST TECHNIQUE: Multidetector CT imaging of the abdomen and pelvis was performed following the standard protocol without IV contrast. RADIATION DOSE REDUCTION: This exam was performed according to the departmental dose-optimization program which includes automated exposure control, adjustment of the mA and/or kV according to patient size and/or use of iterative reconstruction technique. COMPARISON:  08/19/2023 FINDINGS: Lower chest: Enlarged heart. Mild aortic and coronary artery calcifications. Minimal bibasilar atelectasis. Hepatobiliary: Interval sludge in the gallbladder. Small liver containing 2 previously demonstrated small cysts, not needing imaging follow-up. Pancreas: Unremarkable. No pancreatic ductal dilatation or surrounding inflammatory changes. Spleen: Normal in size without focal abnormality. Adrenals/Urinary Tract: Previously demonstrated bilateral simple appearing renal cysts not needing imaging follow-up. Unremarkable adrenal glands and ureters. Poorly distended urinary bladder containing interval medium density fluid measuring 54 Hounsfield units in density. Stomach/Bowel: Stomach is within normal limits. Appendix appears normal. No evidence of bowel wall thickening, distention, or inflammatory changes. Vascular/Lymphatic: Atheromatous arterial calcifications without aneurysm. No enlarged lymph nodes. Reproductive: Markedly enlarged prostate gland. Moderate-sized bilateral hydroceles. Probable left scrotal varicocele. Other: Small right inguinal hernia containing fat, small to moderate-sized left inguinal hernia containing fat and tiny umbilical hernia containing fat. Musculoskeletal: Mild bilateral hip degenerative changes. Moderate lumbar and lower thoracic spine degenerative changes with changes of DISH. IMPRESSION: 1. Interval medium density fluid in the urinary bladder, consistent with hemorrhage. 2. Interval sludge in the gallbladder. 3. Markedly enlarged prostate gland. 4.  Moderate-sized bilateral hydroceles. 5. Probable left scrotal varicocele. 6. Cardiomegaly. 7. Mild calcific coronary artery and aortic atherosclerosis. Electronically Signed   By: Beckie Salts M.D.   On: 08/20/2023 18:41   CT HEAD WO CONTRAST ( ) Result Date: 08/20/2023 CLINICAL DATA:  89 year old male with altered mental status, found in bathroom. EXAM: CT HEAD WITHOUT CONTRAST TECHNIQUE: Contiguous axial images were obtained from the base of the skull through the vertex without intravenous contrast. RADIATION DOSE REDUCTION: This exam was performed according to the departmental dose-optimization program which includes automated exposure control, adjustment of the mA and/or kV according to patient size and/or use of iterative reconstruction technique. COMPARISON:  Brain MRI 07/29/2020. FINDINGS: Brain: Stable cerebral volume. No midline shift, ventriculomegaly, mass effect, evidence of mass lesion, intracranial hemorrhage or evidence of cortically based acute infarction. Patchy and confluent bilateral cerebral white matter hypodensity appears stable to FLAIR signal changes on previous MRI. Vascular: Calcified atherosclerosis at the skull base. No suspicious intracranial vascular hyperdensity. Skull: Intact.  No acute osseous abnormality identified. Sinuses/Orbits: Visualized paranasal sinuses and mastoids are stable and well aerated. Other: No acute orbit or scalp soft tissue finding. IMPRESSION: 1. No acute intracranial abnormality. 2. Stable appearance of the brain compared to 2022 MRI. Electronically Signed   By: Odessa Fleming M.D.   On: 08/20/2023 16:58   CT ABDOMEN PELVIS W CONTRAST Result Date: 08/19/2023 CLINICAL DATA:  Pancreatitis suspected EXAM: CT ABDOMEN AND PELVIS WITH CONTRAST TECHNIQUE: Multidetector CT imaging of the abdomen and pelvis was performed using the standard protocol following bolus administration of intravenous contrast. RADIATION DOSE REDUCTION: This exam was performed according to the  departmental dose-optimization program which includes automated exposure control, adjustment of the mA and/or kV according to patient size and/or use of iterative reconstruction technique. CONTRAST:  80mL OMNIPAQUE IOHEXOL 300 MG/ML  SOLN COMPARISON:  CT abdomen pelvis 01/30/2023 FINDINGS: Lower chest: Prominent cardiac size.  Coronary artery calcification. Hepatobiliary: No focal liver abnormality. No gallstones, gallbladder wall thickening, or pericholecystic fluid. No biliary dilatation. Pancreas: No focal lesion. Normal pancreatic contour. No surrounding inflammatory changes. No main pancreatic ductal dilatation. Spleen: Normal in size without focal abnormality. Adrenals/Urinary Tract: No adrenal nodule bilaterally. Bilateral kidneys enhance symmetrically. Fluid density lesions of the kidneys likely represent simple renal cysts. Simple renal cysts, in the absence of clinically indicated signs/symptoms, require no independent follow-up. No hydronephrosis. No hydroureter. Punctate left nephrolithiasis. No right nephrolithiasis. No ureterolithiasis. The urinary bladder is unremarkable. Stomach/Bowel: Stomach is within normal limits. No evidence of bowel wall thickening or dilatation. Colonic diverticulosis. Appendix appears normal. Vascular/Lymphatic: No abdominal aorta or iliac aneurysm. Severe atherosclerotic plaque of the aorta and its branches. No abdominal, pelvic, or inguinal lymphadenopathy. Reproductive: The prostate is enlarged measuring up to 6.7 cm. Other: No intraperitoneal free fluid. No intraperitoneal free gas. No organized fluid collection. Musculoskeletal: No abdominal wall hernia or abnormality. No suspicious lytic or blastic osseous lesions. No acute displaced fracture. Multilevel degenerative changes of the spine. IMPRESSION: 1. No CT finding of acute pancreatitis. 2. Colonic diverticulosis with no acute diverticulitis. 3. Prostatomegaly. 4. Nonobstructive punctate right nephrolithiasis. 5.   Aortic Atherosclerosis (ICD10-I70.0). Electronically Signed   By: Tish Frederickson M.D.   On: 08/19/2023 00:57   DG Chest 2 View Result Date: 08/18/2023 CLINICAL DATA:  cP EXAM: CHEST - 2 VIEW COMPARISON:  Chest x-ray 06/07/2020 FINDINGS: The heart and mediastinal contours are unchanged. No focal consolidation. No pulmonary edema. No pleural effusion. No pneumothorax. No acute osseous abnormality. Chronic exaggerated kyphotic curvature with multilevel anterior compression deformities of the upper mid thoracic spine. Degenerative changes of the shoulders. IMPRESSION: No active cardiopulmonary disease. Electronically Signed   By: Tish Frederickson M.D.   On: 08/18/2023 19:10    Microbiology: Results for orders placed or performed during the hospital encounter of 08/20/23  Resp panel by RT-PCR (RSV, Flu A&B, Covid) Anterior Nasal Swab     Status: None   Collection Time: 08/20/23  3:54 PM   Specimen: Anterior Nasal Swab  Result Value Ref Range Status   SARS Coronavirus 2 by RT PCR NEGATIVE NEGATIVE Final    Comment: (NOTE) SARS-CoV-2 target nucleic acids are NOT DETECTED.  The SARS-CoV-2 RNA is generally detectable in upper respiratory specimens during the acute phase of infection. The lowest concentration of SARS-CoV-2 viral copies this assay can detect is 138 copies/mL. A negative result does not preclude SARS-Cov-2 infection and should not be used as the sole basis for treatment or other patient management decisions. A negative result may  occur with  improper specimen collection/handling, submission of specimen other than nasopharyngeal swab, presence of viral mutation(s) within the areas targeted by this assay, and inadequate number of viral copies(<138 copies/mL). A negative result must be combined with clinical observations, patient history, and epidemiological information. The expected result is Negative.  Fact Sheet for Patients:  BloggerCourse.com  Fact Sheet  for Healthcare Providers:  SeriousBroker.it  This test is no t yet approved or cleared by the [place] FDA and  has been authorized for detection and/or diagnosis of SARS-CoV-2 by FDA under an Emergency Use Authorization (EUA). This EUA will remain  in effect (meaning this test can be used) for the duration of the COVID-19 declaration under Section 564(b)(1) of the Act, 21 U.S.C.section 360bbb-3(b)(1), unless the authorization is terminated  or revoked sooner.       Influenza A by PCR NEGATIVE NEGATIVE Final   Influenza B by PCR NEGATIVE NEGATIVE Final    Comment: (NOTE) The Xpert Xpress SARS-CoV-2/FLU/RSV plus assay is intended as an aid in the diagnosis of influenza from Nasopharyngeal swab specimens and should not be used as a sole basis for treatment. Nasal washings and aspirates are unacceptable for Xpert Xpress SARS-CoV-2/FLU/RSV testing.  Fact Sheet for Patients: BloggerCourse.com  Fact Sheet for Healthcare Providers: SeriousBroker.it  This test is not yet approved or cleared by the [place] FDA and has been authorized for detection and/or diagnosis of SARS-CoV-2 by FDA under an Emergency Use Authorization (EUA). This EUA will remain in effect (meaning this test can be used) for the duration of the COVID-19 declaration under Section 564(b)(1) of the Act, 21 U.S.C. section 360bbb-3(b)(1), unless the authorization is terminated or revoked.     Resp Syncytial Virus by PCR NEGATIVE NEGATIVE Final    Comment: (NOTE) Fact Sheet for Patients: BloggerCourse.com  Fact Sheet for Healthcare Providers: SeriousBroker.it  This test is not yet approved or cleared by the [place] FDA and has been authorized for detection and/or diagnosis of SARS-CoV-2 by FDA under an Emergency Use Authorization (EUA). This EUA will remain in effect (meaning  this test can be used) for the duration of the COVID-19 declaration under Section 564(b)(1) of the Act, 21 U.S.C. section 360bbb-3(b)(1), unless the authorization is terminated or revoked.  Performed at [facility], [address], [place], [place] [address]   Urine Culture     Status: None   Collection Time: 08/20/23  4:33 PM   Specimen: Urine, Clean Catch  Result Value Ref Range Status   Specimen Description   Final    URINE, CLEAN CATCH Performed at [facility], [address] [place], [place] [address]    Special Requests   Final    NONE Performed at [facility], [address] [place], [place] [address]    Culture   Final    NO GROWTH Performed at [facility], [address], [place], [place] [address]    Report Status 08/21/2023 FINAL  Final    Labs: CBC: Recent Labs  Lab 08/18/23 1824 08/20/23 1543 08/21/23 0349 08/23/23 0423  WBC 6.9 6.8 6.1 7.5  HGB 14.2 15.1 13.6 14.3  HCT 41.6 43.3 38.6* 40.8  MCV 93.7 92.1 91.0 90.3  PLT 219 252 223 232   Basic Metabolic Panel: Recent Labs  Lab 08/18/23 1824 08/20/23 1543 08/21/23 0349 08/22/23 0444 08/23/23 0423  NA 132* 133* 131* 128* 129*  K 4.2 4.5 3.9 3.8 3.7  CL 103 98 102 99 97*  CO2 20* 26 20* 21* 23  GLUCOSE 137* 108* 90 98 101*  BUN 20 19 16 20 19   CREATININE 1.06 1.20 0.94 1.10 0.94  CALCIUM 9.0 9.3 8.8* 8.6* 9.0  MG  --   --   --   --  2.1  PHOS  --   --   --   --  3.0   Liver Function Tests: Recent Labs  Lab 08/20/23 1543 08/21/23 0349  AST 25 16  ALT 13 15  ALKPHOS 70 56  BILITOT 1.7* 1.3*  PROT 7.0 5.5*  ALBUMIN 4.1 3.3*   CBG: No results for input(s): "GLUCAP" in the last 168 hours.  Discharge time spent: greater than 30 minutes.  Signed: Lurene Shadow, MD [facility] 08/23/2023

## 2023-08-23 NOTE — Progress Notes (Signed)
 Occupational Therapy Treatment Patient Details Name: Johnathan Byrd MRN: [id number] DOB: [birth year] Today's Date: 08/23/2023   History of present illness 89 y/o male presented to ED on 08/20/23 for AMS. Admitted for hyponatremia and AMS with unclear etiology. PMH: BPH, Afib, prediabetes, CHF, CKD stage IIIa   OT comments  Pt is seated in recliner on arrival. Pleasant and agreeable to OT session. He denies pain. Pt performed STS from recliner with CGA to RW and CGA progressing to SBA for in room mobility. ADL session performed for dressing and bathing to prepare for DC home today. Pt able to stand at sink with unilateral support intermittently and SBA for safety. Toilet transfer with SBA, SUP for seated hygiene. Min A for LB dressing seated on toilet. Set up assist for UB dressing.  Edu on pacing and UE support for balance to prevent falls upon DC. Pt returned to recliner with all needs in place and will cont to require skilled acute OT services to maximize his safety and IND to return to PLOF.       If plan is discharge home, recommend the following:  A little help with walking and/or transfers;A little help with bathing/dressing/bathroom;Assist for transportation;Help with stairs or ramp for entrance;Assistance with cooking/housework   Equipment Recommendations  Other (comment);Tub/shower seat (RW)    Recommendations for Other Services      Precautions / Restrictions Precautions Precautions: Fall Restrictions Weight Bearing Restrictions Per Provider Order: No       Mobility Bed Mobility               General bed mobility comments: NT up in recliner pre/post session    Transfers Overall transfer level: Needs assistance Equipment used: Rolling walker (2 wheels) Transfers: Sit to/from Stand Sit to Stand: Contact guard assist           General transfer comment: CGA for STS from recliner to RW; CGA progressing to SBA for in room mobility using RW     Balance Overall  balance assessment: Needs assistance Sitting-balance support: No upper extremity supported, Feet supported Sitting balance-Leahy Scale: Good     Standing balance support: Bilateral upper extremity supported, During functional activity, Reliant on assistive device for balance Standing balance-Leahy Scale: Good                             ADL either performed or assessed with clinical judgement   ADL Overall ADL's : Needs assistance/impaired     Grooming: Oral care;Wash/dry face;Wash/dry hands;Supervision/safety;Standing   Upper Body Bathing: Supervision/ safety;Standing   Lower Body Bathing: Contact guard assist;Sit to/from stand   Upper Body Dressing : Set up;Sitting   Lower Body Dressing: Minimal assistance;Sitting/lateral leans;Sit to/from stand Lower Body Dressing Details (indicate cue type and reason): to get pants and underwear over his feet Toilet Transfer: Supervision/safety;Rolling walker (2 wheels);Grab bars   Toileting- Clothing Manipulation and Hygiene: Sitting/lateral lean;Supervision/safety              Extremity/Trunk Assessment              Vision       Perception     Praxis     Communication Communication Communication: Impaired Factors Affecting Communication: Hearing impaired   Cognition Arousal: Alert Behavior During Therapy: [facility] for tasks assessed/performed  Following commands: Impaired Following commands impaired: Follows multi-step commands with increased time      Cueing   Cueing Techniques: Verbal cues, Gestural cues, Tactile cues, Visual cues  Exercises      Shoulder Instructions       General Comments      Pertinent Vitals/ Pain       Pain Assessment Pain Assessment: No/denies pain  Home Living                                          Prior Functioning/Environment              Frequency  Min 2X/week        Progress Toward  Goals  OT Goals(current goals can now be found in the care plan section)  Progress towards OT goals: Progressing toward goals  Acute Rehab OT Goals Patient Stated Goal: return home OT Goal Formulation: With patient Time For Goal Achievement: 09/04/23 Potential to Achieve Goals: Good  Plan      Co-evaluation                 AM-PAC OT "6 Clicks" Daily Activity     Outcome Measure   Help from another person eating meals?: None Help from another person taking care of personal grooming?: None Help from another person toileting, which includes using toliet, bedpan, or urinal?: A Little   Help from another person to put on and taking off regular upper body clothing?: None Help from another person to put on and taking off regular lower body clothing?: A Little 6 Click Score: 18    End of Session Equipment Utilized During Treatment: Rolling walker (2 wheels)  OT Visit Diagnosis: Other abnormalities of gait and mobility (R26.89);Unsteadiness on feet (R26.81)   Activity Tolerance Patient tolerated treatment well   Patient Left with call bell/phone within reach;in chair;with chair alarm set;with family/visitor present   Nurse Communication Mobility status        Time: 1610-9604 OT Time Calculation (min): 29 min  Charges: OT General Charges $OT Visit: 1 Visit OT Treatments $Self Care/Home Management : 23-37 mins  Damare Serano, OTR/L  08/23/23, 12:42 PM   Kimberley Dastrup E Garvis Downum 08/23/2023, 12:40 PM

## 2023-08-23 NOTE — TOC Transition Note (Signed)
 Transition of Care [facility]) - Discharge Note   Patient Details  Name: Johnathan Byrd MRN: [id number] Date of Birth: 1930/08/22  [facility] CM/SW Contact:  Chapman Fitch, RN Phone Number: 08/23/2023, 10:14 AM   Clinical Narrative:      Patient to discharge today Son at bedside for transport RW referral made to [facility] with [employer] to be delivered  Therapy recommending home health  Therapy orders faxed to [facility] outpatient therapy at [phone]       Patient Goals and CMS Choice            Discharge Placement                       Discharge Plan and Services Additional resources added to the After Visit Summary for                                       Social Drivers of Health (SDOH) Interventions SDOH Screenings   Food Insecurity: Patient Unable To Answer (08/21/2023)  Housing: Patient Unable To Answer (08/21/2023)  Transportation Needs: Patient Unable To Answer (08/21/2023)  Utilities: Patient Unable To Answer (08/21/2023)  Financial Resource Strain: Low Risk  (06/25/2023)   Received from [facility]  Social Connections: Patient Unable To Answer (08/21/2023)  Tobacco Use: Low Risk  (08/20/2023)     Readmission Risk Interventions     No data to display

## 2023-08-26 ENCOUNTER — Ambulatory Visit: Admitting: Urology

## 2023-08-26 ENCOUNTER — Telehealth: Payer: Self-pay | Admitting: Urology

## 2023-08-26 ENCOUNTER — Other Ambulatory Visit: Payer: Self-pay | Admitting: *Deleted

## 2023-08-26 MED ORDER — TAMSULOSIN HCL 0.4 MG PO CAPS: 0.4000 mg | ORAL_CAPSULE | Freq: Every day | ORAL | 11 refills | Status: AC

## 2023-08-26 NOTE — Telephone Encounter (Signed)
 Notified patient to restart tamsulosin per Dr. Cherylene Corrente last note. RX sent

## 2023-08-26 NOTE — Telephone Encounter (Signed)
 Patient's daughter called with patient by her side, stating that he was in the Emergency room for a combination of things including UTI. Patient is scheduled to see Samantha tomorrow on  08/27/2023 at 10:00 am. Patient does have a question regarding his medication Tamsulosin. Whether he is supposed to be taking it or not. Please advise patient.

## 2023-08-27 ENCOUNTER — Ambulatory Visit: Admitting: Physician Assistant

## 2023-08-27 VITALS — BP 133/91 | HR 68 | Ht 70.0 in | Wt 179.0 lb

## 2023-08-27 DIAGNOSIS — R300 Dysuria: Secondary | ICD-10-CM | POA: Diagnosis not present

## 2023-08-27 DIAGNOSIS — R3129 Other microscopic hematuria: Secondary | ICD-10-CM

## 2023-08-27 LAB — URINALYSIS, COMPLETE
Bilirubin, UA: NEGATIVE
Glucose, UA: NEGATIVE
Ketones, UA: NEGATIVE
Leukocytes,UA: NEGATIVE
Nitrite, UA: NEGATIVE
Protein,UA: NEGATIVE
Specific Gravity, UA: 1.015 (ref 1.005–1.030)
Urobilinogen, Ur: 4 mg/dL — ABNORMAL HIGH (ref 0.2–1.0)
pH, UA: 6 (ref 5.0–7.5)

## 2023-08-27 LAB — MICROSCOPIC EXAMINATION: RBC, Urine: >30 /HPF — AB (ref 0–2)

## 2023-08-27 NOTE — Progress Notes (Signed)
 08/27/2023 10:28 AM   Johnathan Byrd 12/13/1930 [id number]  CC: Chief Complaint  Patient presents with   Recurrent UTI   HPI: Johnathan Byrd is a 89 y.o. male with PMH BPH with prostatomegaly on Flomax and finasteride, elevated PSA with multiple benign biopsies, and a recent history of microscopic hematuria and dysuria who presents today for hospital follow-up.  He is accompanied today by his daughter, who contributes to HPI.  He was admitted from 08/20/2023 to 08/23/2023 with altered mental status.  Dr. Lonna Cobb had recently put him on amoxicillin for strep mitis UTI, and the course was completed during his admission.  He had a CTAP 7 days ago that showed blood product in the bladder.  Today he reports his dysuria has resolved with antibiotics.  He did have a couple of episodes of gross hematuria with the dysuria, however this has resolved.  He is a never smoker, but admits to occupational secondhand smoke exposure.  In-office UA today positive for 3+ blood and 4.0 EU/DL urobilinogen; urine microscopy with >30 RBCs/HPF.  PMH: Past Medical History:  Diagnosis Date   Atrial fibrillation (HCC)    a.) CHA2DS2-VASc = 4 (age x 2, CHF, HTN). b.) rate/rhythm maintained on oral metoprolol succinate; chronically anticoagulated with apixaban.   B12 deficiency    BPH associated with nocturia    BPPV (benign paroxysmal positional vertigo)    CHF (congestive heart failure) (HCC)    CKD (chronic kidney disease), stage III (HCC)    Glaucoma    Incomplete right bundle branch block (RBBB)    Long term current use of anticoagulant    a.) apixaban   Macular degeneration     Surgical History: Past Surgical History:  Procedure Laterality Date   COLONOSCOPY W/ POLYPECTOMY     GREEN LIGHT LASER TURP (TRANSURETHRAL RESECTION OF PROSTATE N/A 10/19/2021   Procedure: GREEN LIGHT LASER TURP (TRANSURETHRAL RESECTION OF PROSTATE;  Surgeon: Orson Ape, MD;  Location: [facility] ORS;  Service: Urology;   Laterality: N/A;    Home Medications:  Allergies as of 08/27/2023       Reactions   Other Other (See Comments)   Unknown Unknown   Sulfa Antibiotics         Medication List        Accurate as of August 27, 2023 10:28 AM. If you have any questions, ask your nurse or doctor.          brimonidine 0.2 % ophthalmic solution Commonly known as: ALPHAGAN Place 1 drop into the right eye 2 (two) times daily.   Eliquis 5 MG Tabs tablet Generic drug: apixaban TAKE 1 TABLET(5 MG) BY MOUTH TWICE DAILY   finasteride 5 MG tablet Commonly known as: PROSCAR Take 1 tablet (5 mg total) by mouth daily.   metoprolol succinate 50 MG 24 hr tablet Commonly known as: TOPROL-XL TAKE 1 TABLET(50 MG) BY MOUTH EVERY EVENING   omeprazole 40 MG capsule Commonly known as: PRILOSEC Take 40 mg by mouth daily.   sodium chloride 1 g tablet Take 1 tablet (1 g total) by mouth 2 (two) times daily with a meal.   tamsulosin 0.4 MG Caps capsule Commonly known as: FLOMAX Take 1 capsule (0.4 mg total) by mouth daily after supper.   Vitamin B-12 2500 MCG Subl Place under the tongue.        Allergies:  Allergies  Allergen Reactions   Other Other (See Comments)    Unknown  Unknown   Sulfa Antibiotics  Family History: Family History  Problem Relation Age of Onset   Alzheimer's disease Mother    Stroke Father     Social History:   reports that he has never smoked. He has never used smokeless tobacco. He reports that he does not drink alcohol and does not use drugs.  Physical Exam: BP (!) 133/91   Pulse 68   Ht 5\' 10"  (1.778 m)   Wt 179 lb (81.2 kg)   BMI 25.68 kg/m   Constitutional:  Alert and oriented, no acute distress, nontoxic appearing HEENT: [place], AT Cardiovascular: No clubbing, cyanosis, or edema Respiratory: Normal respiratory effort, no increased work of breathing Skin: No rashes, bruises or suspicious lesions Neurologic: Grossly intact, no focal deficits, moving all  4 extremities Psychiatric: Normal mood and affect  Laboratory Data: Results for orders placed or performed in visit on 08/27/23  Microscopic Examination   Collection Time: 08/27/23  9:56 AM   Urine  Result Value Ref Range   WBC, UA 0-5 0 - 5 /hpf   RBC, Urine >30 (A) 0 - 2 /hpf   Epithelial Cells (non renal) 0-10 0 - 10 /hpf   Bacteria, UA Few None seen/Few  Urinalysis, Complete   Collection Time: 08/27/23  9:56 AM  Result Value Ref Range   Specific Gravity, UA 1.015 1.005 - 1.030   pH, UA 6.0 5.0 - 7.5   Color, UA Yellow Yellow   Appearance Ur Clear Clear   Leukocytes,UA Negative Negative   Protein,UA Negative Negative/Trace   Glucose, UA Negative Negative   Ketones, UA Negative Negative   RBC, UA 3+ (A) Negative   Bilirubin, UA Negative Negative   Urobilinogen, Ur 4.0 (H) 0.2 - 1.0 mg/dL   Nitrite, UA Negative Negative   Microscopic Examination See below:    Assessment & Plan:   1. Dysuria (Primary) Resolved on culture appropriate amoxicillin.  No further treatment indicated. - Urinalysis, Complete  2. Microscopic hematuria Persistent microscopic hematuria, differential includes BPH, hemorrhagic cystitis, or possible bladder tumor.  Will send for urine cytology today and pursue cystoscopy if abnormal.  Otherwise, he prefers a more conservative approach, which is reasonable given his age.  Return if symptoms worsen or fail to improve.  Kathreen Pare, PA-C  [facility] [place] [address] [place], [place] [address] [phone]

## 2023-09-07 NOTE — Progress Notes (Unsigned)
 Cardiology Clinic Note   Date: 09/09/2023 ID: Vishaal, Klare 1930-09-18, MRN [id number]  Primary Cardiologist:  Belva Boyden, MD  Chief Complaint   Johnathan Byrd is a 89 y.o. male who presents to the clinic today for routine follow up.   Patient Profile   Johnathan Byrd is followed by Dr. Gollan for the history outlined below.      Past medical history significant for: Chronic diastolic heart failure. Echo 07/10/2021 (performed at [facility]): EF 65%.  Normal RV size/function.  Moderate BAE.  No significant valvular abnormalities. Permanent A-fib. Onset 2013. 3-day ZIO 08/24/2020: HR 44 to 141 bpm, average 77 bpm.  100% A-fib burden.  1 pause lasting 3 seconds at 3:39 AM.  No daytime pauses. Hypertension.  In summary, patient was previously followed by cardiologist at [facility].  He was first evaluated by Dr. Gollan on 01/10/2022 to establish care after moving to [facility] independent living.  Patient has a history of A-fib with onset in 2013.  He had a normal, low risk Lexiscan performed at [facility] in September 2017.  He underwent hospital admission at [facility] in October 2019 for A-fib and diastolic heart failure in the setting of pneumonia.  Patient was last seen in the office by Dr. Gollan on 07/16/2022 for routine follow-up.  He was doing well at that time with no complaints.  No medication changes were made.     History of Present Illness    Today, patient is accompanied by a friend. He was recently in the hospital for altered mental status. He reports he was given a muscle relaxer for hiccups and may have taken too many. He had altered mental status and was admitted to the hospital for UTI. He is back home at [facility] now and slowly improving. He is working with PT/OT. Patient denies shortness of breath, dyspnea on exertion, lower extremity edema, orthopnea or PND. No chest pain, pressure, or tightness. No palpitations.  He has no cardiac awareness of arrhythmia.  He had an episode of hematuria right before he went into the hospital but none since. No blood in his stool. He recently had labs checked with PCP. Given recent confusion with medications he is meeting with a  pharmacist to discuss getting prepackaged medications and would like to get my opinion on this. Given his cardiac medications are stable I think this would be appropriate moving forward to avoid any further medication confusion.     ROS: All other systems reviewed and are otherwise negative except as noted in History of Present Illness.  EKGs/Labs Reviewed    EKG Interpretation Date/Time:  Monday September 09 2023 13:28:25 EDT Ventricular Rate:  66 PR Interval:    QRS Duration:  80 QT Interval:  378 QTC Calculation: 396 R Axis:   -13  Text Interpretation: Atrial fibrillation Nonspecific T wave abnormality When compared with ECG of 18-Aug-2023 18:24, Nonspecific T wave abnormality, improved in Inferior leads Nonspecific T wave abnormality no longer evident in Anterior leads Confirmed by Morey Ar [phone]) on 09/09/2023 1:36:35 PM   08/21/2023: ALT 15; AST 16 08/23/2023: BUN 19; Creatinine, Ser 0.94; Potassium 3.7; Sodium 129   08/23/2023: Hemoglobin 14.3; WBC 7.5   08/22/2023: TSH 2.210    Risk Assessment/Calculations     CHA2DS2-VASc Score = 4   This indicates a 4.8% annual risk of stroke. The patient's score is based upon: CHF History: 1 HTN History: 1 Diabetes History: 0 Stroke History: 0 Vascular Disease History:  0 Age Score: 2 Gender Score: 0             Physical Exam    VS:  BP 116/76 (BP Location: Left Arm, Patient Position: Sitting, Cuff Size: Normal)   Pulse 73   Ht 5\' 7"  (1.702 m)   Wt 177 lb (80.3 kg)   SpO2 97%   BMI 27.72 kg/m  , BMI Body mass index is 27.72 kg/m.  GEN: Well nourished, well developed, in no acute distress. Neck: No JVD or carotid bruits. Cardiac: Irregularly irregular. No murmurs. No rubs or gallops.   Respiratory:   Respirations regular and unlabored. Clear to auscultation without rales, wheezing or rhonchi. GI: Soft, nontender, nondistended. Extremities: Radials/DP/PT 2+ and equal bilaterally. No clubbing or cyanosis. No edema.  Skin: Warm and dry, no rash. Neuro: Strength intact.  Assessment & Plan   Chronic diastolic heart failure Echo February 2023 showed normal LV/RV function, moderate BAE, no significant valvular abnormalities.  Patient denies shortness of breath, DOE, orthopnea, PND, lower extremity edema, or abdominal fullness/distention.  Euvolemic and well compensated on exam. -Continue Toprol .  Permanent A-fib Onset 2013.  3-day ZIO April 2022 showed HR 44 to 141 bpm, average 77 bpm, 100% A-fib burden, 1 pause lasting 3 seconds at 3:39 AM, no additional pauses.  No spontaneous bleeding concerns after one episode of hematuria prior to hospital admission for UTI at the beginning of April. He has no cardiac awareness of arrhythmia. EKG demonstrates afib 66 bpm.  -Continue Toprol  and Eliquis . Appropriate Eliquis  dose.  Hypertension BP today 116/76. No report of dizziness or headaches.  -Continue Toprol .  Disposition: Return in 1 year or sooner as needed.          Signed, Lonell Rives. Myrene Bougher, DNP, NP-C

## 2023-09-09 ENCOUNTER — Ambulatory Visit: Attending: Student | Admitting: Student

## 2023-09-09 ENCOUNTER — Encounter: Payer: Self-pay | Admitting: Student

## 2023-09-09 VITALS — BP 116/76 | HR 73 | Ht 67.0 in | Wt 177.0 lb

## 2023-09-09 DIAGNOSIS — I5032 Chronic diastolic (congestive) heart failure: Secondary | ICD-10-CM

## 2023-09-09 DIAGNOSIS — I10 Essential (primary) hypertension: Secondary | ICD-10-CM | POA: Diagnosis not present

## 2023-09-09 DIAGNOSIS — I4821 Permanent atrial fibrillation: Secondary | ICD-10-CM

## 2023-09-09 NOTE — Patient Instructions (Signed)
 Medication Instructions:  No changes at this time.   *If you need a refill on your cardiac medications before your next appointment, please call your pharmacy*  Lab Work: None  If you have labs (blood work) drawn today and your tests are completely normal, you will receive your results only by: MyChart Message (if you have MyChart) OR A paper copy in the mail If you have any lab test that is abnormal or we need to change your treatment, we will call you to review the results.  Testing/Procedures: None  Follow-Up: At [facility], you and your health needs are our priority.  As part of our continuing mission to provide you with exceptional heart care, our providers are all part of one team.  This team includes your primary Cardiologist (physician) and Advanced Practice Providers or APPs (Physician Assistants and Nurse Practitioners) who all work together to provide you with the care you need, when you need it.  Your next appointment:   1 year(s)  Provider:   Morey Ar, NP

## 2023-09-17 ENCOUNTER — Telehealth: Payer: Self-pay | Admitting: Physician Assistant

## 2023-09-17 NOTE — Telephone Encounter (Signed)
 Urine cytology was negative for cancer cells.  Please keep follow-up as scheduled.

## 2023-09-18 IMAGING — MR MR PROSTATE WO/W CM
56 series · 56 of 56 positions shown · IV contrast (8ml Gadavist)
Comparison: None.

CLINICAL DATA: Elevated PSA level.

EXAM:
MR PROSTATE WITHOUT AND WITH CONTRAST
TECHNIQUE: Multiplanar multisequence MRI images were obtained of the pelvis
centered about the prostate. Pre and post contrast images were
obtained.
CONTRAST:  8mL GADAVIST GADOBUTROL 1 MMOL/ML IV SOLN

[Series 3: (email) · sagittal · 5.0mm · 1.56mm/px · 1 of 11 slices shown (1 of 2)]
[im 1/11]
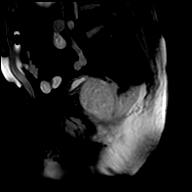

[Series 3: (email) · axial · 5.0mm · 1.56mm/px · 1 of 6 slices shown (2 of 2)]
[im 1/6]
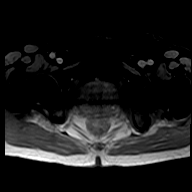

[Series 4: ax in&out whole · axial · 6.0mm · 0.74mm/px · 1 of 35 slices shown (1 of 2)]
[im 1/35]
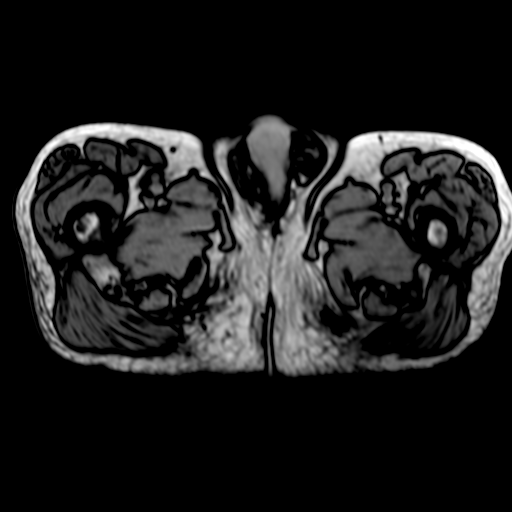

[Series 4: ax in&out whole · axial · 6.0mm · 0.74mm/px · 1 of 35 slices shown (2 of 2)]
[im 1/35]
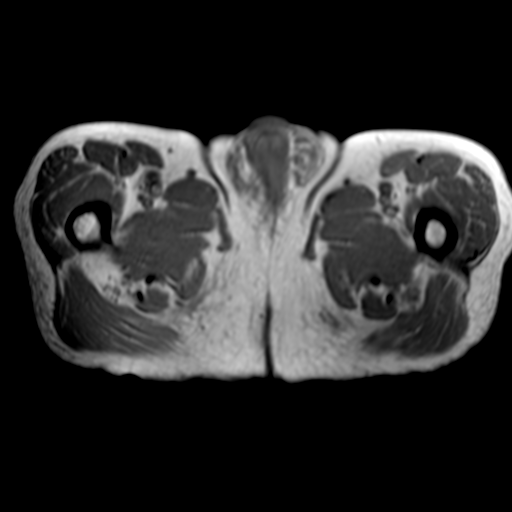

[Series 5: T2 · coronal · 3.0mm · 0.70mm/px · 1 of 35 slices shown (1 of 3)]
[im 1/35]
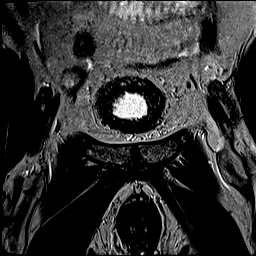

[Series 6: T2 · axial · 3.0mm · 0.56mm/px · 1 of 30 slices shown (2 of 3)]
[im 1/30]
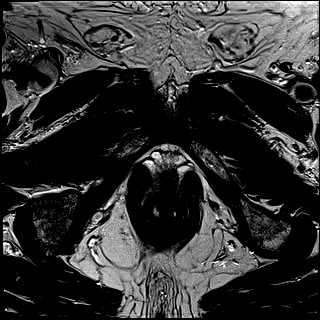

[Series 7: DWI · axial · 3.0mm · 0.86mm/px · 1 of 84 slices shown (1 of 3)]
[im 1/84]
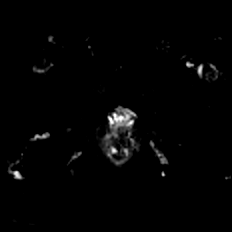

[Series 8: DWI · axial · 3.0mm · 0.86mm/px · 1 of 28 slices shown (2 of 3)]
[im 1/28]
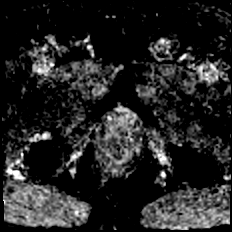

[Series 9: DWI · axial · 3.0mm · 0.86mm/px · 1 of 28 slices shown (3 of 3)]
[im 1/28]
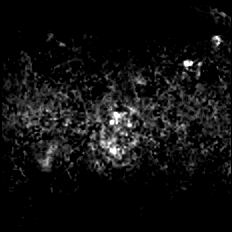

[Series 10: T2 · axial · 1.0mm · 1.04mm/px · 1 of 88 slices shown (3 of 3)]
[im 1/88]
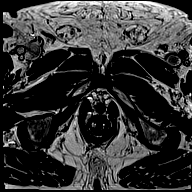

[Series 11: T1 · axial · 3.0mm · 1.15mm/px · 1 of 28 slices shown (1 of 46)]
[im 1/28]
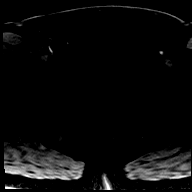

[Series 12: T1 · axial · 3.0mm · 1.15mm/px · 1 of 28 slices shown (2 of 46)]
[im 1/28]
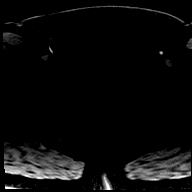

[Series 13: T1 · axial · 3.0mm · 1.15mm/px · 1 of 28 slices shown (3 of 46)]
[im 1/28]
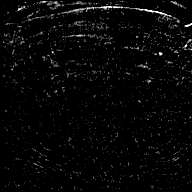

[Series 14: T1 · axial · 3.0mm · 1.15mm/px · 1 of 28 slices shown (4 of 46)]
[im 1/28]
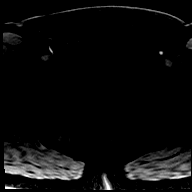

[Series 15: T1 · axial · 3.0mm · 1.15mm/px · 1 of 28 slices shown (5 of 46)]
[im 1/28]
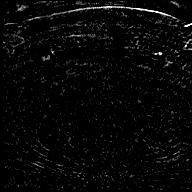

[Series 16: T1 · axial · 3.0mm · 1.15mm/px · 1 of 28 slices shown (6 of 46)]
[im 1/28]
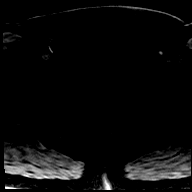

[Series 17: T1 · axial · 3.0mm · 1.15mm/px · 1 of 28 slices shown (7 of 46)]
[im 1/28]
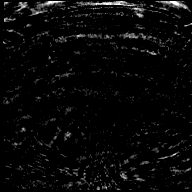

[Series 18: T1 · axial · 3.0mm · 1.15mm/px · 1 of 28 slices shown (8 of 46)]
[im 1/28]
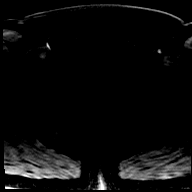

[Series 19: T1 · axial · 3.0mm · 1.15mm/px · 1 of 26 slices shown (9 of 46)]
[im 1/26]
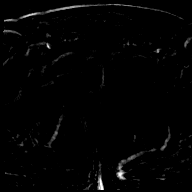

[Series 20: T1 · axial · 3.0mm · 1.15mm/px · 1 of 28 slices shown (10 of 46)]
[im 1/28]
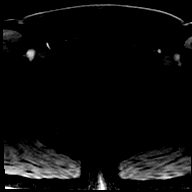

[Series 21: T1 · axial · 3.0mm · 1.15mm/px · 1 of 25 slices shown (11 of 46)]
[im 1/25]
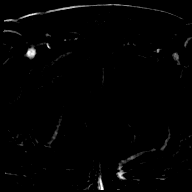

[Series 22: T1 · axial · 3.0mm · 1.15mm/px · 1 of 28 slices shown (12 of 46)]
[im 1/28]
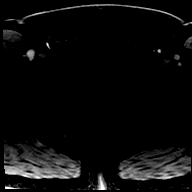

[Series 23: T1 · axial · 3.0mm · 1.15mm/px · 1 of 25 slices shown (13 of 46)]
[im 1/25]
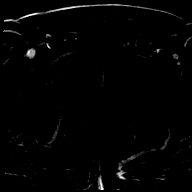

[Series 24: T1 · axial · 3.0mm · 1.15mm/px · 1 of 28 slices shown (14 of 46)]
[im 1/28]
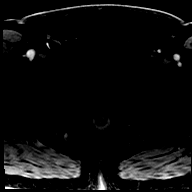

[Series 25: T1 · axial · 3.0mm · 1.15mm/px · 1 of 25 slices shown (15 of 46)]
[im 1/25]
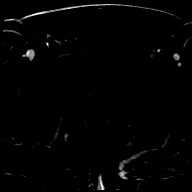

[Series 26: T1 · axial · 3.0mm · 1.15mm/px · 1 of 28 slices shown (16 of 46)]
[im 1/28]
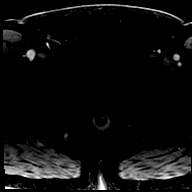

[Series 27: T1 · axial · 3.0mm · 1.15mm/px · 1 of 28 slices shown (17 of 46)]
[im 1/28]
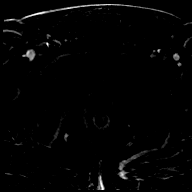

[Series 28: T1 · axial · 3.0mm · 1.15mm/px · 1 of 28 slices shown (18 of 46)]
[im 1/28]
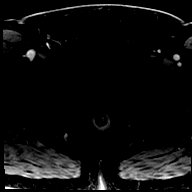

[Series 29: T1 · axial · 3.0mm · 1.15mm/px · 1 of 27 slices shown (19 of 46)]
[im 1/27]
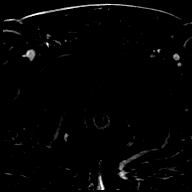

[Series 30: T1 · axial · 3.0mm · 1.15mm/px · 1 of 28 slices shown (20 of 46)]
[im 1/28]
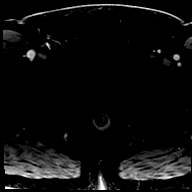

[Series 31: T1 · axial · 3.0mm · 1.15mm/px · 1 of 28 slices shown (21 of 46)]
[im 1/28]
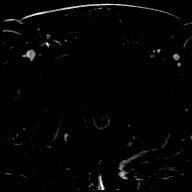

[Series 32: T1 · axial · 3.0mm · 1.15mm/px · 1 of 28 slices shown (22 of 46)]
[im 1/28]
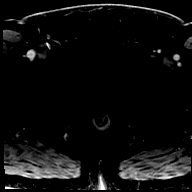

[Series 33: T1 · axial · 3.0mm · 1.15mm/px · 1 of 28 slices shown (23 of 46)]
[im 1/28]
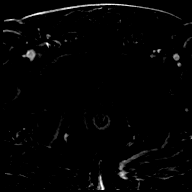

[Series 34: T1 · axial · 3.0mm · 1.15mm/px · 1 of 28 slices shown (24 of 46)]
[im 1/28]
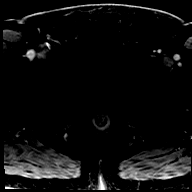

[Series 35: T1 · axial · 3.0mm · 1.15mm/px · 1 of 28 slices shown (25 of 46)]
[im 1/28]
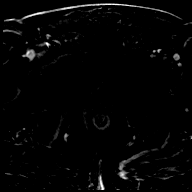

[Series 36: T1 · axial · 3.0mm · 1.15mm/px · 1 of 28 slices shown (26 of 46)]
[im 1/28]
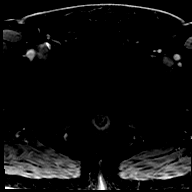

[Series 37: T1 · axial · 3.0mm · 1.15mm/px · 1 of 28 slices shown (27 of 46)]
[im 1/28]
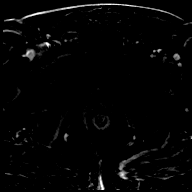

[Series 38: T1 · axial · 3.0mm · 1.15mm/px · 1 of 28 slices shown (28 of 46)]
[im 1/28]
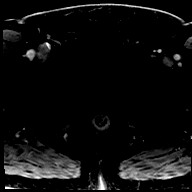

[Series 39: T1 · axial · 3.0mm · 1.15mm/px · 1 of 28 slices shown (29 of 46)]
[im 1/28]
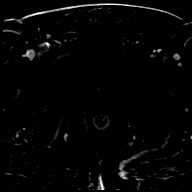

[Series 40: T1 · axial · 3.0mm · 1.15mm/px · 1 of 28 slices shown (30 of 46)]
[im 1/28]
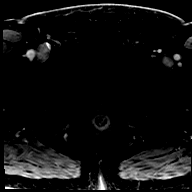

[Series 41: T1 · axial · 3.0mm · 1.15mm/px · 1 of 28 slices shown (31 of 46)]
[im 1/28]
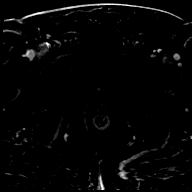

[Series 42: T1 · axial · 3.0mm · 1.15mm/px · 1 of 28 slices shown (32 of 46)]
[im 1/28]
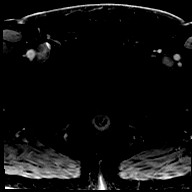

[Series 43: T1 · axial · 3.0mm · 1.15mm/px · 1 of 28 slices shown (33 of 46)]
[im 1/28]
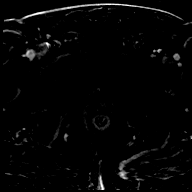

[Series 44: T1 · axial · 3.0mm · 1.15mm/px · 1 of 28 slices shown (34 of 46)]
[im 1/28]
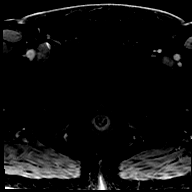

[Series 45: T1 · axial · 3.0mm · 1.15mm/px · 1 of 28 slices shown (35 of 46)]
[im 1/28]
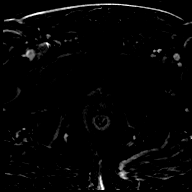

[Series 46: T1 · axial · 3.0mm · 1.15mm/px · 1 of 28 slices shown (36 of 46)]
[im 1/28]
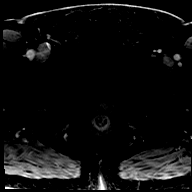

[Series 47: T1 · axial · 3.0mm · 1.15mm/px · 1 of 28 slices shown (37 of 46)]
[im 1/28]
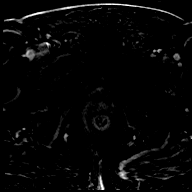

[Series 48: T1 · axial · 3.0mm · 1.15mm/px · 1 of 28 slices shown (38 of 46)]
[im 1/28]
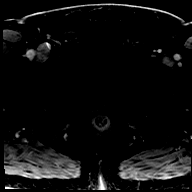

[Series 49: T1 · axial · 3.0mm · 1.15mm/px · 1 of 28 slices shown (39 of 46)]
[im 1/28]
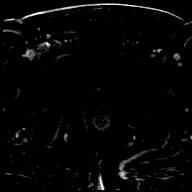

[Series 50: T1 · axial · 3.0mm · 1.15mm/px · 1 of 28 slices shown (40 of 46)]
[im 1/28]
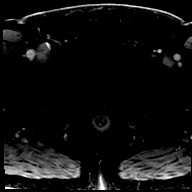

[Series 51: T1 · axial · 3.0mm · 1.15mm/px · 1 of 28 slices shown (41 of 46)]
[im 1/28]
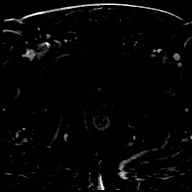

[Series 52: T1 · axial · 3.0mm · 1.15mm/px · 1 of 28 slices shown (42 of 46)]
[im 1/28]
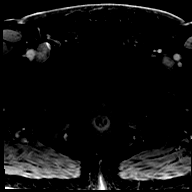

[Series 53: T1 · axial · 3.0mm · 1.15mm/px · 1 of 28 slices shown (43 of 46)]
[im 1/28]
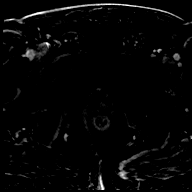

[Series 54: T1 · axial · 3.0mm · 1.15mm/px · 1 of 28 slices shown (44 of 46)]
[im 1/28]
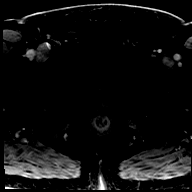

[Series 55: T1 · axial · 3.0mm · 1.15mm/px · 1 of 28 slices shown (45 of 46)]
[im 1/28]
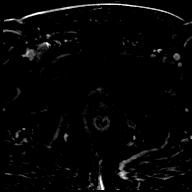

[Series 56: T1 · axial · 3.0mm · 1.15mm/px · 1 of 28 slices shown (46 of 46)]
[im 1/28]
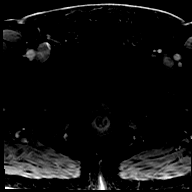

[56 of 56 positions shown; findings below may reference images not displayed]

FINDINGS: Prostate:

Region of interest # 1: PI-RADS category 3 lesion of the right
posterior transition zone the prostate apex, with a mostly
well-defined low T2 signal intensity lesion on image 66 of series 8
demonstrating substantial focal restriction of diffusion (image 22,
series 9) and low ADC map activity (image 22, series 8). This
measures 0.45 cc (1.2 by 0.6 by 0.8 cm).

Encapsulated nodularity in the transition zone compatible with
benign prostatic hypertrophy. Thin peripheral zone bilaterally.

Volume: 3D volumetric analysis: Prostate volume 193.41 cc (7.7 by
6.6 by 7.1 cm).

Transcapsular spread:  Absent

Seminal vesicle involvement: Absent

Neurovascular bundle involvement: Absent

Pelvic adenopathy: Absent

Bone metastasis: Absent

Other findings: Linear accentuated signal in the right lower
perianal region on image 67 of series 61 is probably incidental
although perianal inflammation is not excluded.
IMPRESSION: 1. Small PI-RADS category 3 lesion in the right posterior transition
zone. Targeting data sent to [facility].
2. Marked prostatomegaly.  Benign prostatic hypertrophy.
3. Linear accentuated enhancement in the right lower perianal region
could be from focal inflammation but may be vascular or incidental.

## 2023-09-18 NOTE — Telephone Encounter (Signed)
 Pt informed, voiced understanding

## 2023-09-19 ENCOUNTER — Other Ambulatory Visit: Payer: Self-pay | Admitting: Cardiovascular Disease

## 2024-07-27 ENCOUNTER — Ambulatory Visit: Admitting: Urology
